# Patient Record
Sex: Male | Born: 1990
Health system: Southern US, Community
[De-identification: ages and names within clinical notes are randomized; demographics above are authoritative.]

## PROBLEM LIST (undated history)

## (undated) DIAGNOSIS — S83207A Unspecified tear of unspecified meniscus, current injury, left knee, initial encounter: Secondary | ICD-10-CM

## (undated) DIAGNOSIS — I499 Cardiac arrhythmia, unspecified: Secondary | ICD-10-CM

## (undated) DIAGNOSIS — T4145XA Adverse effect of unspecified anesthetic, initial encounter: Secondary | ICD-10-CM

## (undated) DIAGNOSIS — S83512A Sprain of anterior cruciate ligament of left knee, initial encounter: Secondary | ICD-10-CM

## (undated) DIAGNOSIS — F909 Attention-deficit hyperactivity disorder, unspecified type: Secondary | ICD-10-CM

## (undated) DIAGNOSIS — S83242A Other tear of medial meniscus, current injury, left knee, initial encounter: Secondary | ICD-10-CM

## (undated) DIAGNOSIS — L709 Acne, unspecified: Secondary | ICD-10-CM

## (undated) DIAGNOSIS — T8859XA Other complications of anesthesia, initial encounter: Secondary | ICD-10-CM

## (undated) HISTORY — PX: PERCUTANEOUS PINNING: SHX2209

---
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
## 2015-05-26 ENCOUNTER — Ambulatory Visit (INDEPENDENT_AMBULATORY_CARE_PROVIDER_SITE_OTHER): Payer: Self-pay

## 2015-05-26 ENCOUNTER — Other Ambulatory Visit: Payer: Self-pay | Admitting: Family Medicine

## 2015-05-26 DIAGNOSIS — Z021 Encounter for pre-employment examination: Secondary | ICD-10-CM

## 2017-04-02 ENCOUNTER — Ambulatory Visit (INDEPENDENT_AMBULATORY_CARE_PROVIDER_SITE_OTHER): Payer: Self-pay

## 2017-04-02 ENCOUNTER — Other Ambulatory Visit: Payer: Self-pay | Admitting: Gerontology

## 2017-04-02 DIAGNOSIS — Z021 Encounter for pre-employment examination: Secondary | ICD-10-CM

## 2017-04-24 ENCOUNTER — Encounter (HOSPITAL_COMMUNITY): Payer: Self-pay | Admitting: *Deleted

## 2017-04-24 ENCOUNTER — Emergency Department (HOSPITAL_COMMUNITY): Payer: BLUE CROSS/BLUE SHIELD

## 2017-04-24 ENCOUNTER — Emergency Department (HOSPITAL_COMMUNITY)
Admission: EM | Admit: 2017-04-24 | Discharge: 2017-04-24 | Disposition: A | Discharge status: Home / Self Care | Payer: BLUE CROSS/BLUE SHIELD | Attending: Emergency Medicine | Admitting: Emergency Medicine

## 2017-04-24 DIAGNOSIS — Y9289 Other specified places as the place of occurrence of the external cause: Secondary | ICD-10-CM | POA: Diagnosis not present

## 2017-04-24 DIAGNOSIS — M25462 Effusion, left knee: Secondary | ICD-10-CM | POA: Diagnosis not present

## 2017-04-24 DIAGNOSIS — Y30XXXA Falling, jumping or pushed from a high place, undetermined intent, initial encounter: Secondary | ICD-10-CM | POA: Insufficient documentation

## 2017-04-24 DIAGNOSIS — M25562 Pain in left knee: Secondary | ICD-10-CM | POA: Insufficient documentation

## 2017-04-24 DIAGNOSIS — Y999 Unspecified external cause status: Secondary | ICD-10-CM | POA: Diagnosis not present

## 2017-04-24 DIAGNOSIS — Y9389 Activity, other specified: Secondary | ICD-10-CM | POA: Insufficient documentation

## 2017-04-24 MED ORDER — IBUPROFEN 400 MG PO TABS
600.0000 mg | ORAL_TABLET | Freq: Once | ORAL | Status: AC
  Administered 2017-04-24: 600 mg via ORAL
  Filled 2017-04-24: qty 1

## 2017-04-24 MED ORDER — HYDROCODONE-ACETAMINOPHEN 5-325 MG PO TABS
1.0000 | ORAL_TABLET | Freq: Once | ORAL | Status: AC
  Administered 2017-04-24: 1 via ORAL
  Filled 2017-04-24: qty 1

## 2017-04-24 MED ORDER — IBUPROFEN 600 MG PO TABS: 600.0000 mg | ORAL_TABLET | Freq: Four times a day (QID) | ORAL | 0 refills | Status: DC | PRN

## 2017-04-24 NOTE — ED Provider Notes (Signed)
[facility] EMERGENCY DEPARTMENT Provider Note   CSN: [id number] Arrival date & time: 04/24/17  1347     History   Chief Complaint Chief Complaint  Patient presents with  . Knee Pain    HPI  Rodney Wiggins is a 26 y.o. Male With no pertinent past medical history, who presents with left knee pain and swelling. Patient reports he was working outside cutting a tree last night and he jumped down onto the ground, he felt a pop in his knee, afterwards he had pain and swelling primarily on the back of the knee. Patient reports he had some relief with a knee brace and over-the-counter pain medications last night but today pain is worse. Patient reports he has decreased range of motion due to pain and swelling. He is able to bear weight but reports painful. No numbness or tingling. No previous surgeries or injuries to the knee. No pain or tenderness at the ankle or hip.       History reviewed. No pertinent past medical history.  There are no active problems to display for this patient.   History reviewed. No pertinent surgical history.     Home Medications    Prior to Admission medications   Not on File    Family History No family history on file.  Social History Social History  Substance Use Topics  . Smoking status: Not on file  . Smokeless tobacco: Not on file  . Alcohol use Not on file     Allergies   Patient has no known allergies.   Review of Systems Review of Systems  Constitutional: Negative for chills and fever.  Musculoskeletal: Positive for arthralgias (L knee) and joint swelling.  Skin: Negative for color change.  Neurological: Negative for numbness.     Physical Exam Updated Vital Signs BP 135/82 (BP Location: Right Arm)   Pulse 66   Temp 97.7 F (36.5 C) (Oral)   Resp 16   SpO2 100%   Physical Exam  Constitutional: He appears well-developed and well-nourished. No distress.  HENT:  Head: Normocephalic and atraumatic.   Eyes: Right eye exhibits no discharge. Left eye exhibits no discharge.  Pulmonary/Chest: Effort normal. No respiratory distress.  Musculoskeletal:  Tenderness and swelling of left knee primarily over lateral and posterior aspect, no ecchymosis, some tenderness to palpation at tibial head and patella. ROM limited by pain and swelling. 2+ DP and PT pulses, distal sensation intact, 5/5 strength with dorsi- & plantarflexion. Pt able to bear weight w/ discomfort. Normal left ankle and hip.  Neurological: He is alert. Coordination normal.  Skin: Skin is warm and dry. Capillary refill takes less than 2 seconds. He is not diaphoretic.  Psychiatric: He has a normal mood and affect. His behavior is normal.  Nursing note and vitals reviewed.    ED Treatments / Results  Labs (all labs ordered are listed, but only abnormal results are displayed) Labs Reviewed - No data to display  EKG  EKG Interpretation None       Radiology Dg Knee Complete 4 Views Left  Result Date: 04/24/2017 CLINICAL DATA:  Pain following fall from tree EXAM: LEFT KNEE - COMPLETE 4+ VIEW COMPARISON:  None. FINDINGS: Frontal, lateral, and bilateral oblique views were obtained. There is no evident fracture or dislocation. There is a sizable joint effusion. Joint spaces appear normal. No erosive change. IMPRESSION: Sizable joint effusion. No acute fracture or dislocation. No appreciable arthropathy. Electronically Signed   By: Bretta Bang III M.D.  On: 04/24/2017 17:00    Procedures Procedures (including critical care time)  Medications Ordered in ED Medications  ibuprofen (ADVIL,MOTRIN) tablet 600 mg (600 mg Oral Given 04/24/17 1628)  HYDROcodone-acetaminophen (NORCO/VICODIN) 5-325 MG per tablet 1 tablet (1 tablet Oral Given 04/24/17 1753)     Initial Impression / Assessment and Plan / ED Course  I have reviewed the triage vital signs and the nursing notes.  Pertinent labs & imaging results that were  available during my care of the patient were reviewed by me and considered in my medical decision making (see chart for details).  Pt presents with pain and swelling of left knee, and moderately restricted range of motion. Pt unable to perform full flexion of the knee.  Pt is without systemic symptoms, erythema or redness of the joint consistent with gout or septic joint.  Patient X-Ray shows sizeable effusion, but is negative for obvious fracture or dislocation. Pain managed in ED. Pt advised to follow up with orthopedics further evaluation and treatment. Patient given brace and crutches while in ED, ice and elevation recommended and return precautions discussed. Patient will be dc home & is agreeable with above plan.  Final Clinical Impressions(s) / ED Diagnoses   Final diagnoses:  Acute pain of left knee  Effusion of left knee    New Prescriptions Discharge Medication List as of 04/24/2017  5:41 PM    START taking these medications   Details  ibuprofen (ADVIL,MOTRIN) 600 MG tablet Take 1 tablet (600 mg total) by mouth every 6 (six) hours as needed., Starting Tue 04/24/2017, Print         Dartha Lodge, PA-C 04/25/17 1610    Rolland Porter, MD 04/29/17 2354

## 2017-04-24 NOTE — ED Triage Notes (Signed)
To ED for eval after injury left knee last night while cutting a tree. States he felt his knee pop out and back in. A knee brace helped the pain last pm but today is with decrease rom.

## 2017-04-24 NOTE — ED Notes (Signed)
Patient verbalizes understanding of discharge instructions. Opportunity for questioning and answers were provided. 

## 2017-04-24 NOTE — Discharge Instructions (Signed)
Your x-ray shows no fracture or dislocation, you do have a sizable joint effusion. Continue to use ibuprofen for pain, ice and elevation and knee sleeve for comfort. Please schedule an appointment with Dr. Lajoyce Corners with orthopedics. If pain becomes worse, knee becomes more swollen, red, or you develop fevers or chills please return to the emergency department for sooner evaluation.

## 2017-04-30 ENCOUNTER — Ambulatory Visit (INDEPENDENT_AMBULATORY_CARE_PROVIDER_SITE_OTHER): Payer: BLUE CROSS/BLUE SHIELD | Admitting: Orthopedic Surgery

## 2017-04-30 ENCOUNTER — Encounter (INDEPENDENT_AMBULATORY_CARE_PROVIDER_SITE_OTHER): Payer: Self-pay | Admitting: Orthopedic Surgery

## 2017-04-30 VITALS — Ht 67.0 in | Wt 180.0 lb

## 2017-04-30 DIAGNOSIS — M25562 Pain in left knee: Secondary | ICD-10-CM

## 2017-04-30 NOTE — Progress Notes (Signed)
   Office Visit Note   Patient: Rodney MangesStephen Wiggins           Date of Birth: 05/07/1991           MRN: [id number] Visit Date: 04/30/2017              Requested by: No referring provider defined for this encounter. PCP: System, Pcp Not In  Chief Complaint  Patient presents with  . Left Knee - Pain      HPI: The patient is a 26 year old gentleman seen today for initial evaluation of left knee pain. He was cutting a tree down on 04/23/17 jumped about 10 feet out of a tree, felt a pop in his knee. Has been having medial and lateral knee pain since. Some giving way of knee. No locking or catching. No pain with extension. Pain with flexion beyond 90 degrees. Today is full weight bearing. Does have a knee brace.  States swelling is improving. Pivoting is painful  Has used ibu without relief for pain.   Assessment & Plan: Visit Diagnoses: No diagnosis found.  Plan: follow up in office in 3-4 weeks if no better. Have provided depomedrol injection today.  If no improvement may consider MRI.  Follow-Up Instructions: No Follow-up on file.   Left Knee Exam   Tenderness  The patient is experiencing tenderness in the lateral joint line and medial joint line.  Range of Motion  Extension: normal  Flexion: abnormal   Muscle Strength   The patient has normal left knee strength.  Tests  Drawer:       Anterior - negative     Posterior - negative Varus: negative Valgus: negative  Other  Erythema: absent Swelling: mild Effusion: no effusion present      Patient is alert, oriented, no adenopathy, well-dressed, normal affect, normal respiratory effort.   Imaging: No results found. No images are attached to the encounter.  Labs: No results found for: HGBA1C, ESRSEDRATE, CRP, LABURIC, REPTSTATUS, GRAMSTAIN, CULT, LABORGA  Orders:  No orders of the defined types were placed in this encounter.  No orders of the defined types were placed in this encounter.    Procedures: No  procedures performed  Clinical Data: No additional findings.  ROS:  All other systems negative, except as noted in the HPI. Review of Systems  Constitutional: Negative for chills and fever.  Musculoskeletal: Positive for arthralgias and joint swelling.  Neurological: Negative for weakness and numbness.    Objective: Vital Signs: Ht 5\' 7"  (1.702 m)   Wt 180 lb (81.6 kg)   BMI 28.19 kg/m   Specialty Comments:  No specialty comments available.  PMFS History: There are no active problems to display for this patient.  No past medical history on file.  No family history on file.  No past surgical history on file. Social History   Occupational History  . Not on file.   Social History Main Topics  . Smoking status: Former Games developermoker  . Smokeless tobacco: Current User    Types: Chew  . Alcohol use Yes     Comment: socially  . Drug use: No  . Sexual activity: Not on file

## 2018-06-18 IMAGING — DX DG KNEE COMPLETE 4+V*L*
4 series · 4 of 4 positions shown · non-contrast
Comparison: None.

CLINICAL DATA: Pain following fall from tree

EXAM:
LEFT KNEE - COMPLETE 4+ VIEW

[knee ap]
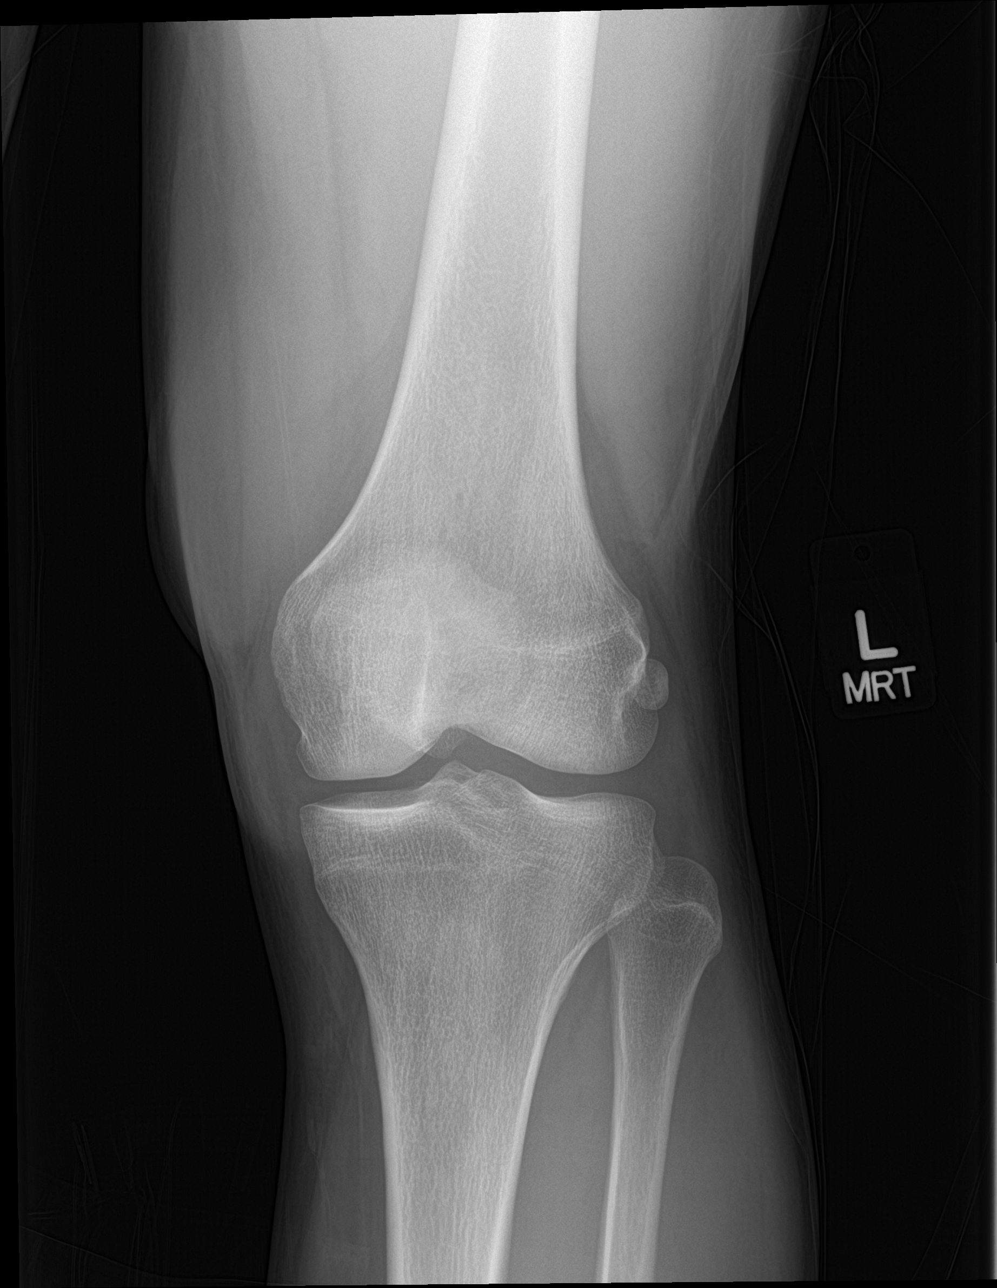

[knee lat]
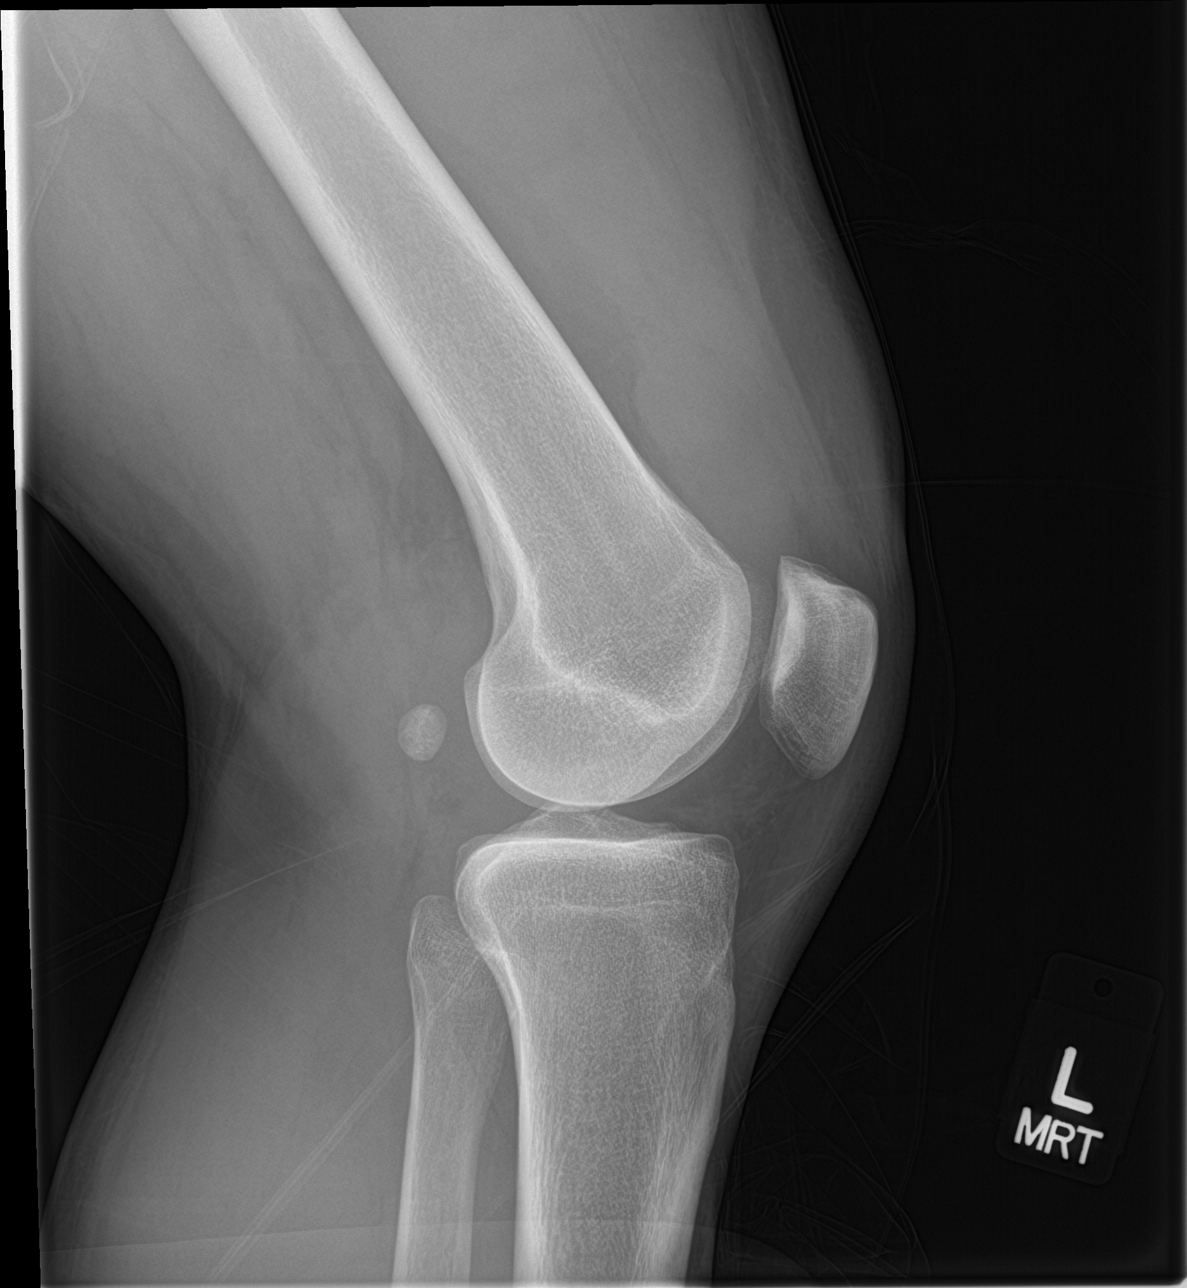

[knee obl (1 of 2)]
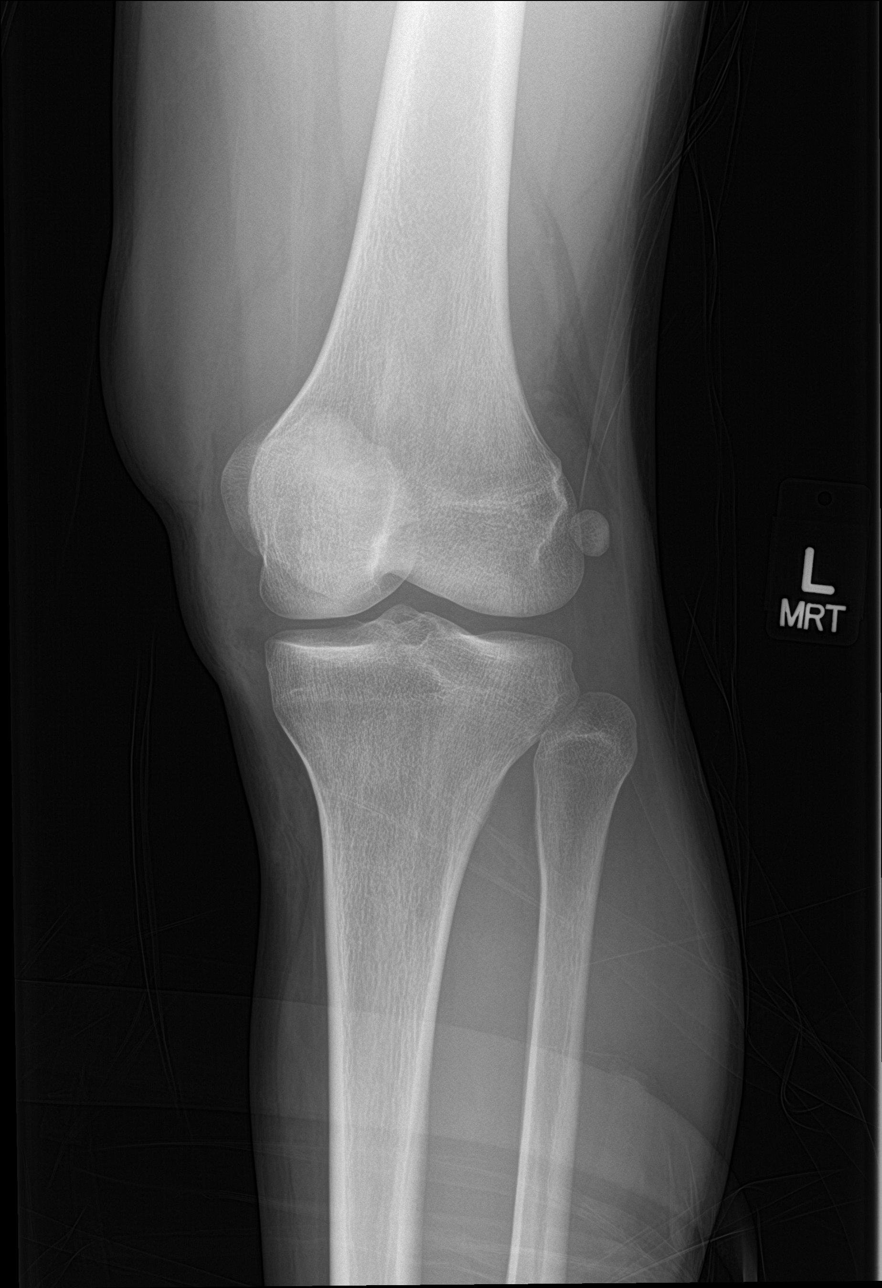

[knee obl (2 of 2)]
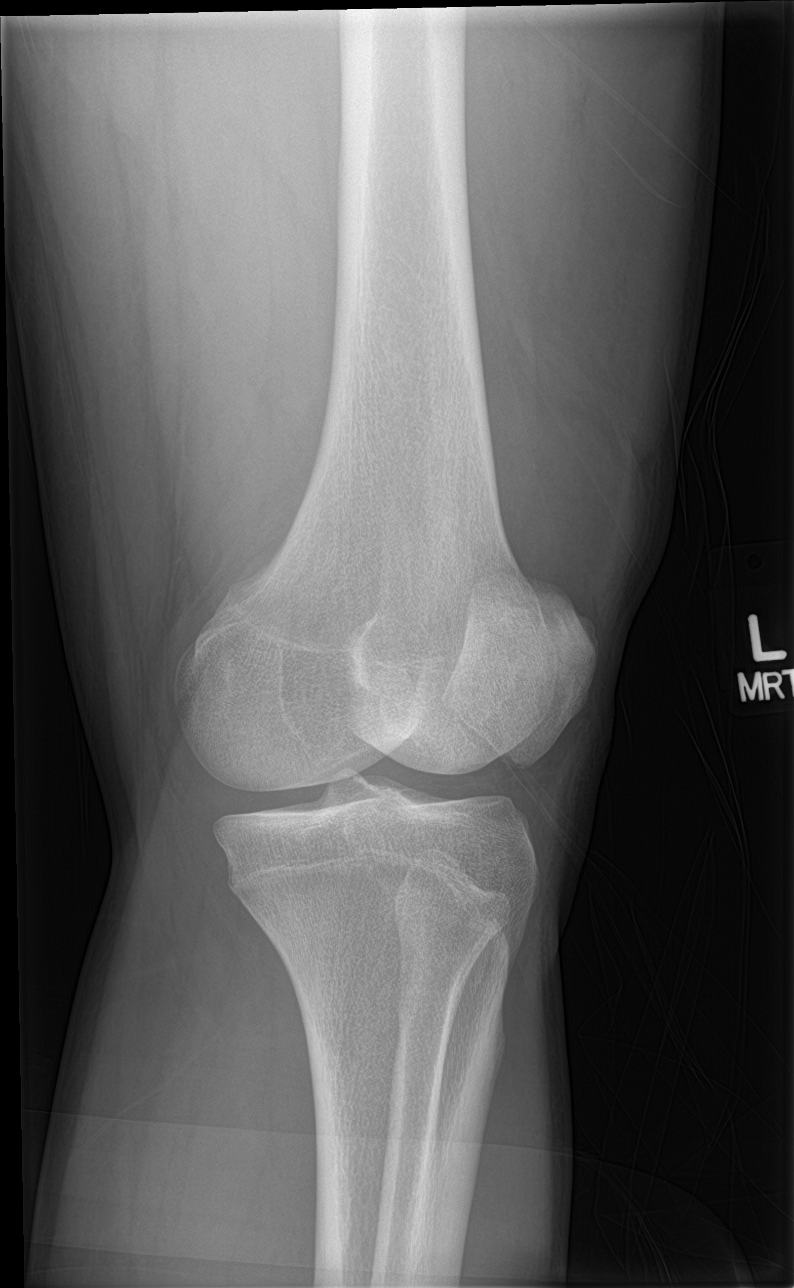

[4 of 4 positions shown; findings below may reference images not displayed]

FINDINGS: Frontal, lateral, and bilateral oblique views were obtained. There
is no evident fracture or dislocation. There is a sizable joint
effusion. Joint spaces appear normal. No erosive change.
IMPRESSION: Sizable joint effusion. No acute fracture or dislocation. No
appreciable arthropathy.

## 2018-12-12 DIAGNOSIS — E291 Testicular hypofunction: Secondary | ICD-10-CM | POA: Diagnosis not present

## 2018-12-12 DIAGNOSIS — R5383 Other fatigue: Secondary | ICD-10-CM | POA: Diagnosis not present

## 2018-12-18 DIAGNOSIS — M25562 Pain in left knee: Secondary | ICD-10-CM | POA: Diagnosis not present

## 2018-12-24 DIAGNOSIS — M25562 Pain in left knee: Secondary | ICD-10-CM | POA: Diagnosis not present

## 2018-12-26 DIAGNOSIS — S83232A Complex tear of medial meniscus, current injury, left knee, initial encounter: Secondary | ICD-10-CM | POA: Diagnosis not present

## 2018-12-26 DIAGNOSIS — M25562 Pain in left knee: Secondary | ICD-10-CM | POA: Diagnosis not present

## 2018-12-26 DIAGNOSIS — S83512A Sprain of anterior cruciate ligament of left knee, initial encounter: Secondary | ICD-10-CM | POA: Diagnosis not present

## 2019-01-01 DIAGNOSIS — L700 Acne vulgaris: Secondary | ICD-10-CM | POA: Diagnosis not present

## 2019-01-09 DIAGNOSIS — R7989 Other specified abnormal findings of blood chemistry: Secondary | ICD-10-CM | POA: Diagnosis not present

## 2019-01-09 DIAGNOSIS — T387X5A Adverse effect of androgens and anabolic congeners, initial encounter: Secondary | ICD-10-CM | POA: Diagnosis not present

## 2019-01-23 ENCOUNTER — Other Ambulatory Visit (HOSPITAL_COMMUNITY): Payer: BC Managed Care – PPO

## 2019-01-23 ENCOUNTER — Other Ambulatory Visit (HOSPITAL_COMMUNITY)
Admission: RE | Admit: 2019-01-23 | Discharge: 2019-01-23 | Disposition: A | Discharge status: Home / Self Care | Payer: BC Managed Care – PPO | Source: Ambulatory Visit | Attending: Orthopedic Surgery | Admitting: Orthopedic Surgery

## 2019-01-23 DIAGNOSIS — Z1159 Encounter for screening for other viral diseases: Secondary | ICD-10-CM | POA: Diagnosis not present

## 2019-01-24 ENCOUNTER — Other Ambulatory Visit: Payer: Self-pay

## 2019-01-24 ENCOUNTER — Encounter (HOSPITAL_BASED_OUTPATIENT_CLINIC_OR_DEPARTMENT_OTHER): Payer: Self-pay | Admitting: *Deleted

## 2019-01-24 LAB — SARS CORONAVIRUS 2 (TAT 6-24 HRS): SARS Coronavirus 2: NEGATIVE

## 2019-01-24 NOTE — Progress Notes (Signed)

## 2019-01-24 NOTE — Progress Notes (Signed)
Spoke w/ pt via phone for pre-op interview.  Npo after mn w/ exception clear liquids until 0600 then nothing by mouth, pt verbalized understanding.  Arrive at 0700.  Pt had covid test done yesterday.

## 2019-01-27 ENCOUNTER — Ambulatory Visit (HOSPITAL_BASED_OUTPATIENT_CLINIC_OR_DEPARTMENT_OTHER)
Admission: RE | Admit: 2019-01-27 | Discharge: 2019-01-27 | Disposition: A | Discharge status: Home / Self Care | Payer: BC Managed Care – PPO | Attending: Orthopedic Surgery | Admitting: Orthopedic Surgery

## 2019-01-27 ENCOUNTER — Ambulatory Visit (HOSPITAL_BASED_OUTPATIENT_CLINIC_OR_DEPARTMENT_OTHER): Payer: BC Managed Care – PPO | Admitting: Anesthesiology

## 2019-01-27 ENCOUNTER — Encounter (HOSPITAL_BASED_OUTPATIENT_CLINIC_OR_DEPARTMENT_OTHER): Payer: Self-pay | Admitting: Anesthesiology

## 2019-01-27 ENCOUNTER — Encounter (HOSPITAL_BASED_OUTPATIENT_CLINIC_OR_DEPARTMENT_OTHER)
Admission: RE | Disposition: A | Discharge status: Home / Self Care | Payer: Self-pay | Source: Home / Self Care | Attending: Orthopedic Surgery

## 2019-01-27 DIAGNOSIS — X58XXXA Exposure to other specified factors, initial encounter: Secondary | ICD-10-CM | POA: Insufficient documentation

## 2019-01-27 DIAGNOSIS — F909 Attention-deficit hyperactivity disorder, unspecified type: Secondary | ICD-10-CM | POA: Insufficient documentation

## 2019-01-27 DIAGNOSIS — S83242A Other tear of medial meniscus, current injury, left knee, initial encounter: Secondary | ICD-10-CM | POA: Diagnosis not present

## 2019-01-27 DIAGNOSIS — M23204 Derangement of unspecified medial meniscus due to old tear or injury, left knee: Secondary | ICD-10-CM | POA: Insufficient documentation

## 2019-01-27 DIAGNOSIS — Y939 Activity, unspecified: Secondary | ICD-10-CM | POA: Insufficient documentation

## 2019-01-27 DIAGNOSIS — S83282A Other tear of lateral meniscus, current injury, left knee, initial encounter: Secondary | ICD-10-CM | POA: Diagnosis present

## 2019-01-27 DIAGNOSIS — M94262 Chondromalacia, left knee: Secondary | ICD-10-CM | POA: Insufficient documentation

## 2019-01-27 DIAGNOSIS — Z79899 Other long term (current) drug therapy: Secondary | ICD-10-CM | POA: Insufficient documentation

## 2019-01-27 DIAGNOSIS — G8918 Other acute postprocedural pain: Secondary | ICD-10-CM | POA: Diagnosis not present

## 2019-01-27 DIAGNOSIS — M93262 Osteochondritis dissecans, left knee: Secondary | ICD-10-CM | POA: Diagnosis not present

## 2019-01-27 DIAGNOSIS — S83512A Sprain of anterior cruciate ligament of left knee, initial encounter: Secondary | ICD-10-CM | POA: Diagnosis present

## 2019-01-27 DIAGNOSIS — M23201 Derangement of unspecified lateral meniscus due to old tear or injury, left knee: Secondary | ICD-10-CM | POA: Diagnosis not present

## 2019-01-27 DIAGNOSIS — Z791 Long term (current) use of non-steroidal anti-inflammatories (NSAID): Secondary | ICD-10-CM | POA: Diagnosis not present

## 2019-01-27 HISTORY — DX: Other complications of anesthesia, initial encounter: T88.59XA

## 2019-01-27 HISTORY — DX: Other tear of medial meniscus, current injury, left knee, initial encounter: S83.242A

## 2019-01-27 HISTORY — DX: Acne, unspecified: L70.9

## 2019-01-27 HISTORY — DX: Sprain of anterior cruciate ligament of left knee, initial encounter: S83.512A

## 2019-01-27 HISTORY — PX: ANTERIOR CRUCIATE LIGAMENT REPAIR: SHX115

## 2019-01-27 HISTORY — DX: Unspecified tear of unspecified meniscus, current injury, left knee, initial encounter: S83.207A

## 2019-01-27 HISTORY — DX: Attention-deficit hyperactivity disorder, unspecified type: F90.9

## 2019-01-27 HISTORY — DX: Cardiac arrhythmia, unspecified: I49.9

## 2019-01-27 SURGERY — RECONSTRUCTION, KNEE, ACL, USING HAMSTRING GRAFT
Anesthesia: General | Laterality: Left

## 2019-01-27 MED ORDER — LACTATED RINGERS IV SOLN
INTRAVENOUS | Status: DC
  Administered 2019-01-27: 08:00:00 via INTRAVENOUS
  Administered 2019-01-27: 50 mL/h via INTRAVENOUS
  Administered 2019-01-27: 11:00:00 via INTRAVENOUS
  Filled 2019-01-27: qty 1000

## 2019-01-27 MED ORDER — HYDROMORPHONE HCL 2 MG/ML IJ SOLN
INTRAMUSCULAR | Status: AC
  Filled 2019-01-27: qty 1

## 2019-01-27 MED ORDER — FENTANYL CITRATE (PF) 100 MCG/2ML IJ SOLN
100.0000 ug | Freq: Once | INTRAMUSCULAR | Status: AC
  Administered 2019-01-27: 100 ug via INTRAVENOUS
  Filled 2019-01-27: qty 2

## 2019-01-27 MED ORDER — MIDAZOLAM HCL 2 MG/2ML IJ SOLN
INTRAMUSCULAR | Status: AC
  Filled 2019-01-27: qty 2

## 2019-01-27 MED ORDER — SODIUM CHLORIDE 0.9 % IR SOLN
Status: DC | PRN
  Administered 2019-01-27: 3000 mL

## 2019-01-27 MED ORDER — ARTIFICIAL TEARS OPHTHALMIC OINT
TOPICAL_OINTMENT | OPHTHALMIC | Status: AC
  Filled 2019-01-27: qty 3.5

## 2019-01-27 MED ORDER — ONDANSETRON HCL 4 MG/2ML IJ SOLN
INTRAMUSCULAR | Status: DC | PRN
  Administered 2019-01-27: 4 mg via INTRAVENOUS

## 2019-01-27 MED ORDER — HYDROMORPHONE HCL 1 MG/ML IJ SOLN
INTRAMUSCULAR | Status: DC | PRN
  Administered 2019-01-27 (×2): .5 mg via INTRAVENOUS
  Administered 2019-01-27: 1 mg via INTRAVENOUS

## 2019-01-27 MED ORDER — PROPOFOL 10 MG/ML IV BOLUS
INTRAVENOUS | Status: AC
  Filled 2019-01-27: qty 40

## 2019-01-27 MED ORDER — ROPIVACAINE HCL 5 MG/ML IJ SOLN
INTRAMUSCULAR | Status: DC | PRN
  Administered 2019-01-27: 30 mL via PERINEURAL

## 2019-01-27 MED ORDER — DEXAMETHASONE SODIUM PHOSPHATE 10 MG/ML IJ SOLN
INTRAMUSCULAR | Status: DC | PRN
  Administered 2019-01-27: 10 mg via INTRAVENOUS

## 2019-01-27 MED ORDER — MEPERIDINE HCL 25 MG/ML IJ SOLN
6.2500 mg | INTRAMUSCULAR | Status: DC | PRN
  Filled 2019-01-27: qty 1

## 2019-01-27 MED ORDER — HYDROMORPHONE HCL 1 MG/ML IJ SOLN
0.2500 mg | INTRAMUSCULAR | Status: DC | PRN
  Administered 2019-01-27 (×5): 0.5 mg via INTRAVENOUS
  Filled 2019-01-27: qty 0.5

## 2019-01-27 MED ORDER — OXYCODONE HCL 5 MG/5ML PO SOLN
5.0000 mg | Freq: Once | ORAL | Status: AC | PRN
  Filled 2019-01-27: qty 5

## 2019-01-27 MED ORDER — DEXAMETHASONE SODIUM PHOSPHATE 10 MG/ML IJ SOLN
INTRAMUSCULAR | Status: AC
  Filled 2019-01-27: qty 1

## 2019-01-27 MED ORDER — OXYCODONE HCL 5 MG PO TABS
ORAL_TABLET | ORAL | Status: AC
  Filled 2019-01-27: qty 1

## 2019-01-27 MED ORDER — ONDANSETRON HCL 4 MG/2ML IJ SOLN
INTRAMUSCULAR | Status: AC
  Filled 2019-01-27: qty 2

## 2019-01-27 MED ORDER — ROCURONIUM BROMIDE 10 MG/ML (PF) SYRINGE
PREFILLED_SYRINGE | INTRAVENOUS | Status: AC
  Filled 2019-01-27: qty 10

## 2019-01-27 MED ORDER — ONDANSETRON 4 MG PO TBDP: 4.0000 mg | ORAL_TABLET | Freq: Three times a day (TID) | ORAL | 0 refills | Status: DC | PRN

## 2019-01-27 MED ORDER — CEFAZOLIN SODIUM-DEXTROSE 2-4 GM/100ML-% IV SOLN
2.0000 g | INTRAVENOUS | Status: AC
  Administered 2019-01-27: 2 g via INTRAVENOUS
  Filled 2019-01-27: qty 100

## 2019-01-27 MED ORDER — MIDAZOLAM HCL 2 MG/2ML IJ SOLN
INTRAMUSCULAR | Status: DC | PRN
  Administered 2019-01-27: 2 mg via INTRAVENOUS

## 2019-01-27 MED ORDER — FENTANYL CITRATE (PF) 100 MCG/2ML IJ SOLN
INTRAMUSCULAR | Status: AC
  Filled 2019-01-27: qty 2

## 2019-01-27 MED ORDER — LIDOCAINE 2% (20 MG/ML) 5 ML SYRINGE
INTRAMUSCULAR | Status: DC | PRN
  Administered 2019-01-27: 60 mg via INTRAVENOUS

## 2019-01-27 MED ORDER — HYDROMORPHONE HCL 1 MG/ML IJ SOLN
INTRAMUSCULAR | Status: AC
  Filled 2019-01-27: qty 1

## 2019-01-27 MED ORDER — PROPOFOL 10 MG/ML IV BOLUS
INTRAVENOUS | Status: DC | PRN
  Administered 2019-01-27: 170 mg via INTRAVENOUS

## 2019-01-27 MED ORDER — OXYCODONE HCL 5 MG PO TABS
5.0000 mg | ORAL_TABLET | Freq: Once | ORAL | Status: AC | PRN
  Administered 2019-01-27: 5 mg via ORAL
  Filled 2019-01-27: qty 1

## 2019-01-27 MED ORDER — OXYCODONE HCL 5 MG PO TABS: 5.0000 mg | ORAL_TABLET | ORAL | 0 refills | Status: AC | PRN

## 2019-01-27 MED ORDER — LIDOCAINE 2% (20 MG/ML) 5 ML SYRINGE
INTRAMUSCULAR | Status: AC
  Filled 2019-01-27: qty 5

## 2019-01-27 MED ORDER — PROMETHAZINE HCL 25 MG/ML IJ SOLN
6.2500 mg | INTRAMUSCULAR | Status: DC | PRN
  Filled 2019-01-27: qty 1

## 2019-01-27 MED ORDER — MIDAZOLAM HCL 2 MG/2ML IJ SOLN
2.0000 mg | Freq: Once | INTRAMUSCULAR | Status: AC
  Administered 2019-01-27: 2 mg via INTRAVENOUS
  Filled 2019-01-27: qty 2

## 2019-01-27 MED ORDER — CHLORHEXIDINE GLUCONATE 4 % EX LIQD
60.0000 mL | Freq: Once | CUTANEOUS | Status: DC
  Filled 2019-01-27: qty 118

## 2019-01-27 MED ORDER — FENTANYL CITRATE (PF) 100 MCG/2ML IJ SOLN
INTRAMUSCULAR | Status: DC | PRN
  Administered 2019-01-27 (×4): 50 ug via INTRAVENOUS

## 2019-01-27 MED ORDER — CEFAZOLIN SODIUM-DEXTROSE 2-4 GM/100ML-% IV SOLN
INTRAVENOUS | Status: AC
  Filled 2019-01-27: qty 100

## 2019-01-27 SURGICAL SUPPLY — 87 items
ANCHOR BUTTON TIGHTROPE ACL RT (Orthopedic Implant) ×2 IMPLANT
BANDAGE ELASTIC 6 VELCRO ST LF (GAUZE/BANDAGES/DRESSINGS) ×3 IMPLANT
BANDAGE ESMARK 6X9 LF (GAUZE/BANDAGES/DRESSINGS) IMPLANT
BLADE 4.2CUDA (BLADE) IMPLANT
BLADE CUDA 5.5 (BLADE) IMPLANT
BLADE CUDA GRT WHITE 3.5 (BLADE) IMPLANT
BLADE GREAT WHITE 4.2 (BLADE) IMPLANT
BLADE GREAT WHITE 4.2MM (BLADE)
BLADE GREAT WHITE SHAVER 5.5 (BLADE) IMPLANT
BLADE GREAT WHITE SHAVER 5.5MM (BLADE)
BLADE SURG 10 STRL SS (BLADE) ×3 IMPLANT
BLADE SURG 15 STRL LF DISP TIS (BLADE) ×1 IMPLANT
BLADE SURG 15 STRL SS (BLADE) ×3
BNDG CMPR 9X6 STRL LF SNTH (GAUZE/BANDAGES/DRESSINGS)
BNDG ESMARK 6X9 LF (GAUZE/BANDAGES/DRESSINGS)
BUR OVAL 4.0 (BURR) IMPLANT
BUR OVAL 6.0 (BURR) IMPLANT
BUR VERTEX HOODED 4.5 (BURR) IMPLANT
BURR OVAL 8 FLU 4.0MM X 13CM (MISCELLANEOUS) ×1
BURR OVAL 8 FLU 4.0X13 (MISCELLANEOUS) ×1 IMPLANT
CANISTER SUCTION 1200CC (MISCELLANEOUS) ×3 IMPLANT
CLOSURE WOUND 1/2 X4 (GAUZE/BANDAGES/DRESSINGS) ×1
COVER BACK TABLE 60X90IN (DRAPES) ×3 IMPLANT
COVER WAND RF STERILE (DRAPES) ×6 IMPLANT
CUFF TOURN SGL QUICK 34 (TOURNIQUET CUFF) ×3
CUFF TRNQT CYL 34X4.125X (TOURNIQUET CUFF) ×1 IMPLANT
DRAPE ARTHROSCOPY W/POUCH 114 (DRAPES) ×3 IMPLANT
DRAPE C-ARM 42X72 X-RAY (DRAPES) IMPLANT
DRAPE INCISE IOBAN 66X45 STRL (DRAPES) IMPLANT
DRAPE SHEET LG 3/4 BI-LAMINATE (DRAPES) IMPLANT
DRAPE U-SHAPE 47X51 STRL (DRAPES) ×3 IMPLANT
DRILL FLIPCUTTER III 6-12 (ORTHOPEDIC DISPOSABLE SUPPLIES) IMPLANT
DURAPREP 26ML APPLICATOR (WOUND CARE) ×3 IMPLANT
ELECT REM PT RETURN 9FT ADLT (ELECTROSURGICAL) ×3
ELECTRODE REM PT RTRN 9FT ADLT (ELECTROSURGICAL) ×1 IMPLANT
FIBERSTICK 2 (SUTURE) IMPLANT
FLIPCUTTER III 6-12 AR-1204FF (ORTHOPEDIC DISPOSABLE SUPPLIES) ×3
GAUZE SPONGE 4X4 12PLY STRL (GAUZE/BANDAGES/DRESSINGS) ×3 IMPLANT
GAUZE XEROFORM 1X8 LF (GAUZE/BANDAGES/DRESSINGS) ×3 IMPLANT
GLOVE BIO SURGEON STRL SZ7.5 (GLOVE) ×3 IMPLANT
GLOVE INDICATOR 8.0 STRL GRN (GLOVE) ×3 IMPLANT
GOWN STRL REUS W/ TWL XL LVL3 (GOWN DISPOSABLE) ×1 IMPLANT
GOWN STRL REUS W/TWL XL LVL3 (GOWN DISPOSABLE) ×3
IV NS IRRIG 3000ML ARTHROMATIC (IV SOLUTION) ×12 IMPLANT
KIT TURNOVER CYSTO (KITS) ×3 IMPLANT
KNEE WRAP E Z 3 GEL PACK (MISCELLANEOUS) ×3 IMPLANT
MANIFOLD NEPTUNE II (INSTRUMENTS) ×3 IMPLANT
NEEDLE HYPO 22GX1.5 SAFETY (NEEDLE) IMPLANT
PACK ARTHROSCOPY DSU (CUSTOM PROCEDURE TRAY) ×3 IMPLANT
PACK BASIN DAY SURGERY FS (CUSTOM PROCEDURE TRAY) ×3 IMPLANT
PAD ABD 8X10 STRL (GAUZE/BANDAGES/DRESSINGS) ×3 IMPLANT
PAD ARMBOARD 7.5X6 YLW CONV (MISCELLANEOUS) IMPLANT
PADDING CAST COTTON 6X4 STRL (CAST SUPPLIES) ×2 IMPLANT
PENCIL BUTTON HOLSTER BLD 10FT (ELECTRODE) IMPLANT
PK GRAFTLINK AUTO IMPLANT SYST (Anchor) ×3 IMPLANT
PROBE APOLLO 90XL (SURGICAL WAND) ×2 IMPLANT
PROBE BIPOLAR 50 DEGREE SUCT (MISCELLANEOUS) ×3 IMPLANT
PROBE BIPOLAR ATHRO 135MM 90D (MISCELLANEOUS) IMPLANT
SET ARTHROSCOPY TUBING (MISCELLANEOUS) ×3
SET ARTHROSCOPY TUBING LN (MISCELLANEOUS) ×1 IMPLANT
SHAVER 4.2 MM LANZA 9391A (BLADE) ×3 IMPLANT
SPONGE LAP 4X18 RFD (DISPOSABLE) ×3 IMPLANT
STRIP CLOSURE SKIN 1/2X4 (GAUZE/BANDAGES/DRESSINGS) ×2 IMPLANT
SUCTION FRAZIER HANDLE 10FR (MISCELLANEOUS) ×2
SUCTION TUBE FRAZIER 10FR DISP (MISCELLANEOUS) ×1 IMPLANT
SUT 2 FIBERLOOP 20 STRT BLUE (SUTURE)
SUT FIBERWIRE #2 38 REV NDL BL (SUTURE)
SUT FIBERWIRE #2 38 T-5 BLUE (SUTURE)
SUT MNCRL AB 3-0 PS2 18 (SUTURE) ×3 IMPLANT
SUT VIC AB 0 CT2 27 (SUTURE) ×3 IMPLANT
SUT VIC AB 2-0 CT2 27 (SUTURE) ×3 IMPLANT
SUTURE 2 FIBERLOOP 20 STRT BLU (SUTURE) IMPLANT
SUTURE FIBERWR #2 38 T-5 BLUE (SUTURE) IMPLANT
SUTURE FIBERWR#2 38 REV NDL BL (SUTURE) IMPLANT
SUTURE TAPE 1.3 40 TPR END (SUTURE) IMPLANT
SUTURE TIGERSTICK 2 TIGERWIR 2 (MISCELLANEOUS) IMPLANT
SUTURETAPE 1.3 40 TPR END (SUTURE) ×3
SYR CONTROL 10ML LL (SYRINGE) ×3 IMPLANT
SYSTEM GRAFT IMPLANT AUTOGRAFT (Anchor) IMPLANT
SYSTEM IMPL ACL/PCL SWIVILLOCK (Anchor) ×2 IMPLANT
TIGERSTICK 2 TIGERWIRE 2 (MISCELLANEOUS)
TOWEL OR 17X26 10 PK STRL BLUE (TOWEL DISPOSABLE) ×6 IMPLANT
TUBE CONNECTING 12'X1/4 (SUCTIONS) ×1
TUBE CONNECTING 12X1/4 (SUCTIONS) ×2 IMPLANT
WAND 30 DEG SABER W/CORD (SURGICAL WAND) IMPLANT
WATER STERILE IRR 500ML POUR (IV SOLUTION) ×3 IMPLANT
WRAP KNEE MAXI GEL POST OP (GAUZE/BANDAGES/DRESSINGS) ×2 IMPLANT

## 2019-01-27 NOTE — Anesthesia Preprocedure Evaluation (Signed)
Anesthesia Evaluation  Patient identified by MRN, date of birth, ID band Patient awake    Reviewed: Allergy & Precautions, NPO status , Patient's Chart, lab work & pertinent test results  Airway Mallampati: II  TM Distance: >3 FB Neck ROM: Full    Dental no notable dental hx.    Pulmonary neg pulmonary ROS,    Pulmonary exam normal breath sounds clear to auscultation       Cardiovascular negative cardio ROS Normal cardiovascular exam Rhythm:Regular Rate:Normal     Neuro/Psych PSYCHIATRIC DISORDERS negative neurological ROS     GI/Hepatic negative GI ROS, Neg liver ROS,   Endo/Other  negative endocrine ROS  Renal/GU negative Renal ROS  negative genitourinary   Musculoskeletal negative musculoskeletal ROS (+)   Abdominal   Peds negative pediatric ROS (+)  Hematology negative hematology ROS (+)   Anesthesia Other Findings ADHD  Reproductive/Obstetrics negative OB ROS                             Anesthesia Physical Anesthesia Plan  ASA: II  Anesthesia Plan: General   Post-op Pain Management:  Regional for Post-op pain   Induction: Intravenous  PONV Risk Score and Plan: 2 and Ondansetron, Midazolam and Treatment may vary due to age or medical condition  Airway Management Planned: LMA  Additional Equipment:   Intra-op Plan:   Post-operative Plan: Extubation in OR  Informed Consent: I have reviewed the patients History and Physical, chart, labs and discussed the procedure including the risks, benefits and alternatives for the proposed anesthesia with the patient or authorized representative who has indicated his/her understanding and acceptance.     Dental advisory given  Plan Discussed with: CRNA  Anesthesia Plan Comments:         Anesthesia Quick Evaluation

## 2019-01-27 NOTE — Op Note (Signed)
Surgery Date: 01/27/19   Surgeon(s): Yolonda Kidaogers, Tavaras Goody Patrick, MD  ASSIST: none  Implants: Arthrex all inside cortical buttons on femur and tibia 4.75 PEEK swivel lock x 1.  ANESTHESIA: general, and adductor block  IV FLUIDS AND URINE: See anesthesia.  TOURNIQUET:  114  Minutes at 300 mmHg  DRAINS: none  COMPLICATIONS: None.   ESTIMATED BLOOD LOSS: minimal  PREOPERATIVE DIAGNOSES:  1. Left knee medial meniscus tear 2.   Left knee complete ACL rupture  POSTOPERATIVE DIAGNOSES:  1.  Left knee medial meniscus tear 2.  Left knee complete ACL rupture 3.  Left knee lateral meniscus tear 4.  Left knee osteochondral lesion medial femoral condyle, grade 4 10 mm x 5 mm  PROCEDURES PERFORMED:  1.   Left knee arthroscopy with Hamstring autograft ACL reconstruction 2.  Partial lateral and medial meniscectomies left knee 3.  Left knee abrasion chondroplasty of medial femoral condyle grade IV chondromalacia  DESCRIPTION OF PROCEDURE:  Rodney MangesStephen Wiggins is a 28 year old male with left knee Lateral and medial meniscus tears, Complete ACL rupture, and cartlige lesion of the medial femoral condyle.  They sustained these injuries about years prior to coming to the operating room today.  After a short period of prehabilitation to allow for return of ROM and quadriceps strenght, we discussed proceeding with arthroscopically assisted hamstring autograft ACL reconstruction and medial and lateral meniscectomy versus repair.  We reviewed the risks benefits and indications of this procedure including but not limited to bleeding, infection, damage to neurovascular structures, need for future surgery, developed an of arthrosis, rupture of graft, continued instability of the knee, and developement of blood clots and risk of anesthesia.  All questions answered.  The patient was identified in the preoperative holding area and the operative extremity was marked. The patient was brought to the  operating room and transferred to operating table in a supine position. Satisfactory general anesthesia was induced by anesthesiology.    Examination under anesthesia revealed a grade 2B Lachman, grade 3 pivot shift, and stable to varus and valgus stress.   The procedure was initiated by obtaining a hamstring graft. An Esmarch was used to wrap out the leg and the tourniquet was raised for a short time. A 3-inch incision was created over the pes anserine. Dissection was carried out to the level of the sartorius, which was divided above the gracilis tendon, then everted to reveal the semitendinosus tendon which was released distally, tagged and stripped per usual.The sartorius and gracilis were then repaired back to their insertion with heavy Vicryl suture, that was later incorporated into the swivel lock anchor.  At the back table, I nextprepared the graft by removing muscle and tenosynovium. The semitendinosis graft were was cut to length and quadrupled, looping around the Arthrex ACL TightRope for the femoral side and ABS tightrope for the tibial side. The two free ends of the autograft were secured to each other at the tibial side with #2interlocking FiberWire sutures.The remainder of the graft was then circumferentially secured according to the manufacturer's recommendations with a 0FiberWire suture twice at the tibial side and twice at the femoral side. The graft was pretensioned on the back table and wrapped in a saline-soaked gauze.  The graft measured 70 mm in total length, 10 mm on femoral tunnel diameter, and 9.5 mm diameter on the tibial tunnel.  Standard anterolateral, anteromedial arthroscopy portals were obtained. The anteromedial portal was obtained with a spinal needle for localization under direct visualization with subsequent diagnostic findings.  Anteromedial and anterolateral chambers: mild synovitis. The synovitis was debrided with a 4.5 mm full radius shaver through both  the anteromedial and lateral portals.   Suprapatellar pouch and gutters: no synovitis or debris. Patella chondral surface: Grade 0 Trochlear chondral surface: Grade 0 Patellofemoral tracking: Midline, no tilt Medial meniscus: radial tear at the midbody in the white zone, flipped up along the medial gutter.  Separate horizontal tear of the posterior horn in the red-white zone  Medial femoral condyle flexion bearing surface: Grade 0 Medial femoral condyle extension bearing surface: Grade IV of 60mmx 5 mm Medial tibial plateau: Grade 0 Anterior cruciate ligament:Complete mid substance tear Posterior cruciate ligament:stable Lateral meniscus: White zone posterior horn radial tear just anterior to the popliteal hiatus.   Lateral femoral condyle flexion bearing surface: Grade 0 Lateral femoral condyle extension bearing surface: Grade 0 Lateral tibial plateau: Grade 1   Next, We turned our attention to the medial meniscus tear.  There were 2 separate tears of the medial meniscus.  1 being a white zone tear of the mid body.  This was a radial tear with a parrot-beak appearance.  The unstable flap was flipped proximally along the medial gutter and wedged.  This was resected with combination of meniscal biter and motorized shaver.  There was also a horizontal component of the meniscus tear along the posterior horn.  This was likewise resected utilizing meniscal biter and shaver to produce a stable border.  The lateral meniscus was inspected closely and found to have a chronic appearing radial tear just at the area of the popliteal tendon.  When probed a portion of the mid body did pull into the joint.  This was resected to prevent any cartilage injury or mechanical catching.  We utilized shaver and biters for this.  Interestingly, the posterior horn appeared chronically injured and was quite diminutive.  There were no unstable tears however along the posterior horn.  The anterior root root and posterior  root were intact.  Next, the ACL reconstruction was undertaken. The ACL stump was removed with thermal ablation and shaver and anatomic bony landmarks were marked for the placement of the femoral and tibial sockets.Arthrex retroguides and Flipcutters were used to create the sockets and perform the procedure by an all-inside GraftLink technique.The femoral socket was created at the inferior portion of the bifurcate ridge of the lateral femoral wall with a size 43mm FlipCutter to a depth of 20 mm while the tibial socket was created at the center of the ACL footprint from front to back and toward the base of the medial tibial eminence from medial to lateral, to a depth of 23-25 mm with a 9.51mm FlipCutter. Bony debris was removed and the edges of socket apertures were smoothed. Suture shuttles were used to deliver the graft into the femoral socket first and the tibial socket second. The graft was then secured within the sockets, cinching the self-locking sutures overtop of the proximal and distal cortical buttons with the knee in a reduced position maintained at 20 degrees flexion while a moderate force posterior drawer was applied.After this preliminary tensioning, the knee was placed through several flexion-extension cycles to eliminate any graft settling or excursion and the graft was re-tensioned in the same manner and the sutures were tied over top of the buttons proximally and distally, and the four tibial sided suture arms were secondarily secured at the proximal tibia with 1SwiveLock anchor.Of note we did also pass a free labral tape through the ACL fixation as  an separate internal brace backup fixation.  Final images of the ACL graft were obtained, revealing no lateral wall or roof impingement of the graft at the notch through range of motion.Stability of the ACL graft was assessed and found to be normalized at grade 0 lachman and grade 0 pivot shift.   The wounds were all closed in layers  per usual.Dressings were applied and a brace placed And locked in 0 of flexion..There were no apparent complications.All counts were correct x2. The patient was awakened and taken to recovery room in satisfactory condition.  POSTOPERATIVE PLAN:  Redmond SchoolStephen Easterwill be touch down weight bearing on crutches until cleared by The therapist. They will likewise be in The knee brace with it locked until quad function is normalized.  He will then progress on the hamstring autograft ACL protocol.  They will be on 81 mg asa daily for 1 month for DVT PPX. they will return to the clinic to see the surgeon in 2 weeks.  Yolonda KidaJason Patrick Elford Evilsizer

## 2019-01-27 NOTE — Transfer of Care (Signed)
Immediate Anesthesia Transfer of Care Note  Patient: Rodney Wiggins  Procedure(s) Performed: Left knee arthroscopic anterior cruciate ligament reconstruction with hamstring autograft, medial meniscus repair, possible microfracture (Left )  Patient Location: PACU  Anesthesia Type:General  Level of Consciousness: awake, alert , oriented and patient cooperative  Airway & Oxygen Therapy: Patient Spontanous Breathing and Patient connected to nasal cannula oxygen  Post-op Assessment: Report given to RN and Post -op Vital signs reviewed and stable  Post vital signs: Reviewed and stable  Last Vitals:  Vitals Value Taken Time  BP    Temp    Pulse    Resp 15 01/27/19 1123  SpO2    Vitals shown include unvalidated device data.  Last Pain:  Vitals:   01/27/19 0702  TempSrc: Oral  PainSc: 0-No pain      Patients Stated Pain Goal: 7 (43/32/95 1884)  Complications: No apparent anesthesia complications

## 2019-01-27 NOTE — Progress Notes (Signed)
Assisted Dr. Miller with left, ultrasound guided, femoral block. Side rails up, monitors on throughout procedure. See vital signs in flow sheet. Tolerated Procedure well. 

## 2019-01-27 NOTE — Anesthesia Procedure Notes (Signed)
Anesthesia Regional Block: Femoral nerve block   Pre-Anesthetic Checklist: ,, timeout performed, Correct Patient, Correct Site, Correct Laterality, Correct Procedure, Correct Position, site marked, Risks and benefits discussed,  Surgical consent,  Pre-op evaluation,  At surgeon's request and post-op pain management  Laterality: Left  Prep: chloraprep       Needles:  Injection technique: Single-shot  Needle Type: Stimiplex     Needle Length: 9cm  Needle Gauge: 21     Additional Needles:   Procedures:,,,, ultrasound used (permanent image in chart),,,,  Narrative:  Start time: 01/27/2019 8:19 AM End time: 01/27/2019 8:24 AM Injection made incrementally with aspirations every 5 mL.  Performed by: Personally  Anesthesiologist: Lynda Rainwater, MD

## 2019-01-27 NOTE — Anesthesia Procedure Notes (Signed)
Procedure Name: LMA Insertion Date/Time: 01/27/2019 8:54 AM Performed by: Wanita Chamberlain, CRNA Pre-anesthesia Checklist: Patient identified, Emergency Drugs available, Suction available, Patient being monitored and Timeout performed Patient Re-evaluated:Patient Re-evaluated prior to induction Oxygen Delivery Method: Circle system utilized Preoxygenation: Pre-oxygenation with 100% oxygen Induction Type: IV induction Ventilation: Mask ventilation without difficulty LMA: LMA inserted LMA Size: 4.0 Number of attempts: 1 Airway Equipment and Method: Bite block Placement Confirmation: breath sounds checked- equal and bilateral,  CO2 detector,  positive ETCO2 and ETT inserted through vocal cords under direct vision Tube secured with: Tape Dental Injury: Teeth and Oropharynx as per pre-operative assessment

## 2019-01-27 NOTE — Discharge Instructions (Signed)
°  Post Anesthesia Home Care Instructions  Activity: Get plenty of rest for the remainder of the day. A responsible individual must stay with you for 24 hours following the procedure.  For the next 24 hours, DO NOT: -Drive a car -Paediatric nurse -Drink alcoholic beverages -Take any medication unless instructed by your physician -Make any legal decisions or sign important papers.  Meals: Start with liquid foods such as gelatin or soup. Progress to regular foods as tolerated. Avoid greasy, spicy, heavy foods. If nausea and/or vomiting occur, drink only clear liquids until the nausea and/or vomiting subsides. Call your physician if vomiting continues.  Special Instructions/Symptoms: Your throat may feel dry or sore from the anesthesia or the breathing tube placed in your throat during surgery. If this causes discomfort, gargle with warm salt water. The discomfort should disappear within 24 hours.        DISCHARGE INSTRUCTIONS: ________________________________________________________________________________ ACL RECONSTRUCTION HOME EXERCISE PROGRAM (0-2 WEEKS)     1. Elevate the leg above your heart as often as possible. 2. Weight bear as tolerated with the immobilizer.  Use crutches as needed, progress from 2 to 1 crutch as able using one crutch on opposite side of surgical knee.  Do not limp and do not walk too much!! 3. Wear immobilizer all the time except when exercising.  Wear immobilizer at night!! 4. Start normal showering according to your surgeons instructions. 5. Goals for first two weeks:  minimal swelling, motion 0-90, walking with immobilizer without crutches and positive attitude about PT. 6. Exercise program to be performed as soon as as able 3-4 times per day followed by ice pack or ice bag for 20 minutes. A. Sitting over edge of bed or counter - Passive Range of Motion using opposite leg to bend and straighten knee as much as possible (5-10 minutes) B. Thigh tensing  exercise - Push the knee into a towel roll and try to raise heel slightly off floor.  Hold for 8 seconds, rest 10 seconds. (15 times every hour) C. Straight leg raise lying on your back with opposite knee bent, keeping surgical knee as straight as possible.  You may do with knee immobilizer initially (3-5 sets of ten) D. Hamstring tensing - Dig heels into floor as if you were attempting to bend knee.  Do not allow knee to bend.  Hold for 8 seconds, rest 10 seconds E. Towel stretch - Towel around ball of foot stretching calf by pulling the ankle back while gradually leaning forward to stretch the back of the knee and thigh.  (Hold 20 seconds, 4 times each) F. Back of knee stretch - While lying on stomach, knee just over edge of bed (hold 2 minutes, 4 times) 7. Use pain medication as needed.  To prevent constipation use Colace 100mg . twice a day while on pain medication.  If constipated, use Miralax 17 gm once a day and drink plenty of fluids.  These medications can be obtained at the pharmacy without a prescription.   8. Follow up in the office in 10-14 days. 9. Leave dressing in place and reinforce as needed for the first 3 days.  You then may remove the Ace bandage and white sterile bandages and begin showering.  You should then cover your wounds with Ace bandage after each shower.  Please do not submerge underwater.

## 2019-01-27 NOTE — H&P (Signed)
ORTHOPAEDIC H and P  REQUESTING PHYSICIAN: Yolonda Kidaogers, Jason Patrick, MD  PCP:  Rebecka ApleyHemberg, Katherine V, NP  Chief Complaint: Left knee instability  HPI: Rodney Wiggins is a 28 y.o. male who complains of chronic left knee instability and pain.  He has been evaluated and worked up in the office and found to have a chronic ACL tear with medial and lateral meniscus pathology.  He presents today for arthroscopic ACL reconstruction with meniscus repair versus meniscectomy.  No new complaints at this time.  No new questions.  Past Medical History:  Diagnosis Date  . Acne   . Acute meniscal tear of left knee   . ADHD (attention deficit hyperactivity disorder)   . Complication of anesthesia    per pt was told during hand surgery 2010 was given BB during surgery for increased heart rate  . Irregular heart beat    per pt had stress test in high school to play football, was told when heart rate goes up irregularity resolved , per pt he has no issues with it  . Left ACL tear    Past Surgical History:  Procedure Laterality Date  . PERCUTANEOUS PINNING  2010  approx.   right hand   Social History   Socioeconomic History  . Marital status: Single    Spouse name: Not on file  . Number of children: Not on file  . Years of education: Not on file  . Highest education level: Not on file  Occupational History  . Not on file  Social Needs  . Financial resource strain: Not on file  . Food insecurity    Worry: Not on file    Inability: Not on file  . Transportation needs    Medical: Not on file    Non-medical: Not on file  Tobacco Use  . Smoking status: Never Smoker  . Smokeless tobacco: Never Used  Substance and Sexual Activity  . Alcohol use: Yes    Comment: socially  . Drug use: Never  . Sexual activity: Not on file  Lifestyle  . Physical activity    Days per week: Not on file    Minutes per session: Not on file  . Stress: Not on file  Relationships  . Social Musicianconnections    Talks  on phone: Not on file    Gets together: Not on file    Attends religious service: Not on file    Active member of club or organization: Not on file    Attends meetings of clubs or organizations: Not on file    Relationship status: Not on file  Other Topics Concern  . Not on file  Social History Narrative  . Not on file   History reviewed. No pertinent family history. No Known Allergies Prior to Admission medications   Medication Sig Start Date End Date Taking? Authorizing Provider  doxycycline (DORYX) 100 MG EC tablet Take 100 mg by mouth daily.   Yes [provider]  ibuprofen (ADVIL) 600 MG tablet Take 600 mg by mouth as needed.   Yes [provider]  amphetamine-dextroamphetamine (ADDERALL) 30 MG tablet Take 1 tablet by mouth daily.  04/16/17   [provider]   No results found.  Positive ROS: All other systems have been reviewed and were otherwise negative with the exception of those mentioned in the HPI and as above.  Physical Exam: General: Alert, no acute distress Cardiovascular: No pedal edema Respiratory: No cyanosis, no use of accessory musculature GI: No organomegaly,  abdomen is soft and non-tender Skin: No lesions in the area of chief complaint Neurologic: Sensation intact distally Psychiatric: Patient is competent for consent with normal mood and affect Lymphatic: No axillary or cervical lymphadenopathy  MUSCULOSKELETAL:  Left knee:  No open wounds or obvious deformities.  Neurovascular intact  Assessment: 1.  Left knee ACL tear 2.  Left knee medial meniscus tear 3.  Left knee lateral meniscus tear 4.  Left knee medial femoral condyle osteochondral lesion  Plan: -Plan for arthroscopic ACL reconstruction with hamstring autograft.  We will also assess the medial and lateral meniscus and repair if able versus meniscectomy.  Lastly, we will assess the cartilage injury to the medial condyle and plan for abrasion chondroplasty versus  microfracture if appropriate.  We have previously discussed the risk, benefits, and indications of this procedure at length.  He has provided informed consent to proceed today.  -Plan for discharge home postoperatively from PACU.    Nicholes Stairs, MD Cell [phone]    01/27/2019 8:22 AM

## 2019-01-27 NOTE — Brief Op Note (Signed)
01/27/2019  11:29 AM  PATIENT:  Laurence Compton  28 y.o. male  PRE-OPERATIVE DIAGNOSIS:  Left knee anterior cruciate ligament tear, medial and lateral meniscus tear  POST-OPERATIVE DIAGNOSIS:  Left knee anterior cruciate ligament tear, medial and lateral meniscus tear  PROCEDURE:  Procedure(s) with comments: Left knee arthroscopic anterior cruciate ligament reconstruction with hamstring autograft, partial medial and lateral menisectomies possible microfracture (Left) - 3 hrs  SURGEON:  Surgeon(s) and Role:    * Nicholes Stairs, MD - Primary  PHYSICIAN ASSISTANT:   ASSISTANTS: none   ANESTHESIA:   regional and general  EBL:  30 cc  BLOOD ADMINISTERED:none  DRAINS: none   LOCAL MEDICATIONS USED:  NONE  SPECIMEN:  No Specimen  DISPOSITION OF SPECIMEN:  N/A  COUNTS:  YES  TOURNIQUET:  * Missing tourniquet times found for documented tourniquets in log: [id number] *  DICTATION: .Note written in EPIC  PLAN OF CARE: Discharge to home after PACU  PATIENT DISPOSITION:  PACU - hemodynamically stable.   Delay start of Pharmacological VTE agent (>24hrs) due to surgical blood loss or risk of bleeding: not applicable

## 2019-01-28 ENCOUNTER — Encounter (HOSPITAL_BASED_OUTPATIENT_CLINIC_OR_DEPARTMENT_OTHER): Payer: Self-pay | Admitting: Orthopedic Surgery

## 2019-01-28 NOTE — Anesthesia Postprocedure Evaluation (Signed)
Anesthesia Post Note  Patient: Rodney Wiggins  Procedure(s) Performed: Left knee arthroscopic anterior cruciate ligament reconstruction with hamstring autograft, medial meniscus repair, possible microfracture (Left )     Patient location during evaluation: PACU Anesthesia Type: General Level of consciousness: awake and alert Pain management: pain level controlled Vital Signs Assessment: post-procedure vital signs reviewed and stable Respiratory status: spontaneous breathing, nonlabored ventilation and respiratory function stable Cardiovascular status: blood pressure returned to baseline and stable Postop Assessment: no apparent nausea or vomiting Anesthetic complications: no    Last Vitals:  Vitals:   01/27/19 1315 01/27/19 1500  BP:  125/79  Pulse: 84 86  Resp: 11 16  Temp:  36.8 C  SpO2: 94% 97%    Last Pain:  Vitals:   01/27/19 1500  TempSrc:   PainSc: [place]

## 2019-02-03 DIAGNOSIS — M25562 Pain in left knee: Secondary | ICD-10-CM | POA: Diagnosis not present

## 2019-02-07 DIAGNOSIS — T387X5A Adverse effect of androgens and anabolic congeners, initial encounter: Secondary | ICD-10-CM | POA: Diagnosis not present

## 2019-02-07 DIAGNOSIS — R7989 Other specified abnormal findings of blood chemistry: Secondary | ICD-10-CM | POA: Diagnosis not present

## 2019-02-10 ENCOUNTER — Ambulatory Visit: Payer: BC Managed Care – PPO | Attending: Orthopedic Surgery | Admitting: Physical Therapy

## 2019-02-10 ENCOUNTER — Other Ambulatory Visit: Payer: Self-pay

## 2019-02-10 ENCOUNTER — Encounter: Payer: Self-pay | Admitting: Physical Therapy

## 2019-02-10 DIAGNOSIS — R600 Localized edema: Secondary | ICD-10-CM | POA: Insufficient documentation

## 2019-02-10 DIAGNOSIS — M25662 Stiffness of left knee, not elsewhere classified: Secondary | ICD-10-CM | POA: Diagnosis not present

## 2019-02-10 DIAGNOSIS — M25562 Pain in left knee: Secondary | ICD-10-CM | POA: Diagnosis not present

## 2019-02-10 NOTE — Therapy (Signed)
[facility] [place], [place], [address] Phone: [phone]   Fax:  [phone]  Physical Therapy Evaluation  Patient Details  Name: Rodney Wiggins MRN: [id number] Date of Birth: 03/02/1991 Referring Provider (PT): Victorino December MD   Encounter Date: 02/10/2019  PT End of Session - 02/10/19 1702    Visit Number  1    Number of Visits  12    Date for PT Re-Evaluation  03/10/19    Authorization Type  PROGRESS NOTE AT 10TH VISIT.  KX MODIFIER AFTER 15 VISITS.    PT Start Time  0315    PT Stop Time  0416    PT Time Calculation (min)  61 min       Past Medical History:  Diagnosis Date  . Acne   . Acute medial meniscus tear of left knee 01/27/2019  . Acute meniscal tear of left knee   . ADHD (attention deficit hyperactivity disorder)   . Complete tear of anterior cruciate ligament of left knee 01/27/2019  . Complication of anesthesia    per pt was told during hand surgery 2010 was given BB during surgery for increased heart rate  . Irregular heart beat    per pt had stress test in high school to play football, was told when heart rate goes up irregularity resolved , per pt he has no issues with it  . Left ACL tear     Past Surgical History:  Procedure Laterality Date  . ANTERIOR CRUCIATE LIGAMENT REPAIR Left 01/27/2019   Procedure: Left knee arthroscopic anterior cruciate ligament reconstruction with hamstring autograft, medial meniscus repair, possible microfracture;  Surgeon: Nicholes Stairs, MD;  Location: [facility];  Service: Orthopedics;  Laterality: Left;  3 hrs  . PERCUTANEOUS PINNING  2010  approx.   right hand    There were no vitals filed for this visit.   Subjective Assessment - 02/10/19 1705    Subjective  COVID-19 screen performed prior to patient entering clinic.  The patient reported injuring his left knee about two years ago and he has been dealing with it.  He elected to undergo and  did so on 01/27/19.  He underwent a menicectomy and ACL reconstruction with hamstring autograft.  He is wearing a brace locked at 0 degrees.  His pain at rest is a 2/10 and higher when out of sling doing his HEP.  He states he has been doing SLR's, heel slides, flexion seated and using right knee to passive flex his left knee and prone SLR's.    Pertinent History  Hand surgery.    Patient Stated Goals  Get back to normal and workout.    Currently in Pain?  Yes    Pain Score  2     Pain Location  Knee    Pain Orientation  Left    Pain Descriptors / Indicators  Aching;Dull;Discomfort    Pain Type  Surgical pain    Pain Onset  1 to 4 weeks ago    Pain Frequency  Intermittent    Aggravating Factors   See above.    Pain Relieving Factors  See above.         [facility] PT Assessment - 02/10/19 0001      Assessment   Medical Diagnosis  Left knee scope with ACL reconstruction and hamstring autograft.    Referring Provider (PT)  Victorino December MD    Onset Date/Surgical Date  --   01/27/19 (surgery date).  Precautions   Precautions  --   PER PROTOCOL.   Required Braces or Orthoses  --   Left knee brace.     Restrictions   Weight Bearing Restrictions  --   WBAT over left LE with brace locked at 0 degrees.     Balance Screen   Has the patient fallen in the past 6 months  No    Has the patient had a decrease in activity level because of a fear of falling?   Yes    Is the patient reluctant to leave their home because of a fear of falling?   No      Prior Function   Level of Independence  Independent      Observation/Other Assessments   Observations  Left lateral incisional site (per patient) has "broke open).  It is closed at this time and appears to be healing well.  Monofilament stitch visible.  Very notable left quadriceps atrophy when contralaterally compared.    Focus on Therapeutic Outcomes (FOTO)   57% limitation.      Observation/Other Assessments-Edema    Edema  Circumferential       ROM / Strength   AROM / PROM / Strength  AROM;Strength      AROM   Overall AROM Comments  In supine the patien's left knee extension is -12 degrees and flexion= 95 degrees.      Strength   Overall Strength Comments  Decreased volitional contraction of his left quadriceps.      Palpation   Palpation comment  Patient not c/o any significant left knee pain.      Ambulation/Gait   Gait Comments  WBAT over his LE with knee brace locked in full extension.                Objective measurements completed on examination: See above findings.      [facility] Adult PT Treatment/Exercise - 02/10/19 0001      Exercises   Exercises  Knee/Hip      Knee/Hip Exercises: Supine   Quad Sets Limitations  15 minutes facilitated with Guern[place]ian e'stim to left quads with 10 sec contraction holds and 10 sec rest.      Modalities   Modalities  Electrical Stimulation;Vasopneumatic      Electrical Stimulation   Electrical Stimulation Location  Left knee    Electrical Stimulation Action  IFC    Electrical Stimulation Parameters  1-10 Hz x 15 minutes.    Electrical Stimulation Goals  Edema;Pain      Vasopneumatic   Number Minutes Vasopneumatic   15 minutes    Vasopnuematic Location   --   Left knee.   Vasopneumatic Pressure  Medium             PT Education - 02/10/19 1708    Education Details  [employer].    Person(s) Educated  Patient    Methods  Explanation;Demonstration    Comprehension  Verbalized understanding;Returned demonstration          PT Long Term Goals - 02/10/19 1729      PT LONG TERM GOAL #1   Title  Independent with a HEP.    Time  4    Period  Weeks    Status  New      PT LONG TERM GOAL #2   Title  Full active left knee extension in order to normalize gait.    Time  4    Period  Weeks    Status  New      PT LONG TERM GOAL #3   Title  Active knee flexion to 125 degrees+ so the patient can perform functional tasks and do so with pain not > 2-3/10.     Time  4    Period  Weeks    Status  New      PT LONG TERM GOAL #4   Title  Increase left knee strength to a solid 5/5 to provide good stability for accomplishment of functional activities.    Time  4    Period  Weeks    Status  New      PT LONG TERM GOAL #5   Title  Perform a reciprocating stair gait with one railing with pain not > 2-3/10.    Time  4    Period  Weeks    Status  New             Plan - 02/10/19 1722    Clinical Impression Statement  The patient present to OPPT s/p left knee arthroscopic surgery that included menicectomies and an ACL reconstruction with a hamstring autograft.  He has an expected amount of edema.  He is currently limited into both left knee flexion and extension.  He has a significnat loss of left quadriceps volitional contraction and obvious atrophy when contralaterally compared.  He walks with his left knee barce locked in full extension and he is wbat.  Patient will benefit from skilled physical therapy intervention to address deficits and pain.    Examination-Activity Limitations  Squat;Locomotion Level    Examination-Participation Restrictions  Other    Stability/Clinical Decision Making  Stable/Uncomplicated    Clinical Decision Making  Low    Rehab Potential  Excellent    PT Frequency  3x / week    PT Duration  4 weeks    PT Treatment/Interventions  ADLs/Self Care Home Management;Cryotherapy;Electrical Stimulation;Gait training;Stair training;Functional mobility training;Therapeutic activities;Therapeutic exercise;Neuromuscular re-education;Manual techniques;Patient/family education;Passive range of motion;Vasopneumatic Device    PT Next Visit Plan  Per protocol.  VMS/Russian to left quads.  Nustep with progression to stationary bike.  Vasopnuematic and electrical stimulation.    Consulted and Agree with Plan of Care  Patient       Patient will benefit from skilled therapeutic intervention in order to improve the following deficits and  impairments:  Abnormal gait, Decreased activity tolerance, Decreased range of motion, Decreased strength, Increased edema, Pain  Visit Diagnosis: 1. Acute pain of left knee   2. Stiffness of left knee, not elsewhere classified   3. Localized edema        Problem List Patient Active Problem List   Diagnosis Date Noted  . Complete tear of anterior cruciate ligament of left knee 01/27/2019  . Acute medial meniscus tear of left knee 01/27/2019  . Acute lateral meniscus tear of left knee 01/27/2019    Canyon Willow, [place] MPT 02/10/2019, 5:34 PM  [facility] [address] [place], [place], [address] Phone: [phone]   Fax:  [phone]  Name: Rodney MangesStephen Wiggins MRN: [id number] Date of Birth: [birth year]

## 2019-02-13 ENCOUNTER — Ambulatory Visit: Payer: BC Managed Care – PPO | Admitting: *Deleted

## 2019-02-19 ENCOUNTER — Ambulatory Visit: Payer: BC Managed Care – PPO | Admitting: Physical Therapy

## 2019-02-19 ENCOUNTER — Other Ambulatory Visit: Payer: Self-pay

## 2019-02-19 DIAGNOSIS — R600 Localized edema: Secondary | ICD-10-CM | POA: Diagnosis not present

## 2019-02-19 DIAGNOSIS — M25662 Stiffness of left knee, not elsewhere classified: Secondary | ICD-10-CM | POA: Diagnosis not present

## 2019-02-19 DIAGNOSIS — M25562 Pain in left knee: Secondary | ICD-10-CM | POA: Diagnosis not present

## 2019-02-19 NOTE — Therapy (Signed)
[facility] [address] [place], [place], [address] Phone: [phone]   Fax:  [phone]  Physical Therapy Treatment  Patient Details  Name: Rodney Wiggins MRN: [id number] Date of Birth: 06-10-1991 Referring Provider (PT): Duwayne HeckJason Rogers MD   Encounter Date: 02/19/2019  PT End of Session - 02/19/19 1134    Visit Number  2    Number of Visits  12    Date for PT Re-Evaluation  03/10/19    Authorization Type  PROGRESS NOTE AT 10TH VISIT.  KX MODIFIER AFTER 15 VISITS.    PT Start Time  1030    PT Stop Time  1125    PT Time Calculation (min)  55 min    Activity Tolerance  Patient tolerated treatment well    Behavior During Therapy  WFL for tasks assessed/performed       Past Medical History:  Diagnosis Date  . Acne   . Acute medial meniscus tear of left knee 01/27/2019  . Acute meniscal tear of left knee   . ADHD (attention deficit hyperactivity disorder)   . Complete tear of anterior cruciate ligament of left knee 01/27/2019  . Complication of anesthesia    per pt was told during hand surgery 2010 was given BB during surgery for increased heart rate  . Irregular heart beat    per pt had stress test in high school to play football, was told when heart rate goes up irregularity resolved , per pt he has no issues with it  . Left ACL tear     Past Surgical History:  Procedure Laterality Date  . ANTERIOR CRUCIATE LIGAMENT REPAIR Left 01/27/2019   Procedure: Left knee arthroscopic anterior cruciate ligament reconstruction with hamstring autograft, medial meniscus repair, possible microfracture;  Surgeon: Yolonda Kidaogers, Jason Patrick, MD;  [facility];  Service: Orthopedics;  Laterality: Left;  3 hrs  . PERCUTANEOUS PINNING  2010  approx.   right hand    There were no vitals filed for this visit.  Subjective Assessment - 02/19/19 1045    Subjective  COVID-19 screen performed prior to patient entering clinic. Pt  arriving in a neoprene brace reporting his Bobbye MortonDonjoy was too restricting.    Pertinent History  Hand surgery.    Currently in Pain?  No/denies         [facility] 0001      Assessment   Medical Diagnosis  Left knee scope with ACL reconstruction and hamstring autograft.    Referring Provider (PT)  Duwayne HeckJason Rogers MD      Precautions   Precautions  --   Per Protocol   Required Braces or Orthoses  --   [name] knee brace set from 0-90 degrees     Balance Screen   Has the patient fallen in the past 6 months  No      AROM   Overall AROM Comments  -5 degrees extension, 100 degrees flexion      Palpation   Palpation comment  TTP long lateral posterior hamstring tendons, with mild bruising noted of L LE      Ambulation/Gait   Gait Comments  WBAT over his LE with knee brace locked in full extension.   Pt wearing neoprene brace today and not his prescribed brace                  [facility] Adult PT Treatment/Exercise - 02/19/19 0001      Knee/Hip Exercises: Standing   Heel  Raises  Both;15 reps    Wall Squat  2 sets;10 reps   5-75 degrees flexion     Knee/Hip Exercises: Supine   Quad Sets Limitations  20 minutes neuromuscular fascilitation  with quadsets progressing to SAQ with [place] E-stim, 10 seconds on/off     Straight Leg Raises  Strengthening;Left;2 sets;10 reps      Modalities   Modalities  Cryotherapy;Electrical Stimulation;Vasopneumatic      Cryotherapy   Number Minutes Cryotherapy  10 Minutes    Cryotherapy Location  Knee    Type of Cryotherapy  Ice pack      Electrical Stimulation   Electrical Stimulation Location  Left knee    Electrical Stimulation Action  Russian 10 seconds on/off    Electrical Stimulation Parameters  20 minutes while performing QS and SAQ    Electrical Stimulation Goals  Neuromuscular facilitation      Vasopneumatic   Number Minutes Vasopneumatic   --   vasopneumatic device were used by other pt's     Manual  Therapy   Manual Therapy  Joint mobilization;Passive ROM    Manual therapy comments  PROM limited flexion per protocol    Joint Mobilization  patella mobs             PT Education - 02/19/19 1132    Education Details  Pt educated on importance of wearing his [name] knee brace that locks him in 0-90 degrees. Pt also instructed in new HEP progression.    Person(s) Educated  Patient    Methods  Explanation;Demonstration    Comprehension  Verbalized understanding;Returned demonstration          PT Long Term Goals - 02/19/19 1154      PT LONG TERM GOAL #1   Title  Independent with a HEP.    Time  4    Status  On-going      PT LONG TERM GOAL #2   Title  Full active left knee extension in order to normalize gait.    Time  4    Period  Weeks    Status  On-going      PT LONG TERM GOAL #3   Title  Active knee flexion to 125 degrees+ so the patient can perform functional tasks and do so with pain not > 2-3/10.    Time  4    Period  Weeks    Status  New      PT LONG TERM GOAL #4   Title  Increase left knee strength to a solid 5/5 to provide good stability for accomplishment of functional activities.    Time  4    Period  Weeks      PT LONG TERM GOAL #5   Title  Perform a reciprocating stair gait with one railing with pain not > 2-3/10.    Time  4    Period  Weeks    Status  New            Plan - 02/19/19 1149    Clinical Impression Statement  Pt presenting to therpay today with neoprene brace walking with normalized gait pattern. Pt non-compliant with wearing his prescribed [name] knee brace with limited flexion and extension from 0-90 degrees. Pt was edu on importance of wearing his brace provided by Dr. Stann Mainland. Pt reporting understanding. Pt also tolerated progression of his exercises with no pain reported. Pt did express tenderness to palpation of his L hamstring with mild bruising noted. Pt with normal respnse  to modalities. Continue with skilled PT following  pt's protocol.    Examination-Activity Limitations  Squat;Locomotion Level    Examination-Participation Restrictions  Other    Stability/Clinical Decision Making  Stable/Uncomplicated    PT Treatment/Interventions  ADLs/Self Care Home Management;Cryotherapy;Electrical Stimulation;Gait training;Stair training;Functional mobility training;Therapeutic activities;Therapeutic exercise;Neuromuscular re-education;Manual techniques;Patient/family education;Passive range of motion;Vasopneumatic Device    PT Next Visit Plan  Per protocol.  VMS/Russian to left quads.  Nustep with progression to stationary bike.  Vasopnuematic and electrical stimulation.    PT Home Exercise Plan  QS, prone knee hangs, hamstring stretch, patella mobs, SAQ, AAROM LAQ, SLR, mini wall squats    Consulted and Agree with Plan of Care  Patient       Patient will benefit from skilled therapeutic intervention in order to improve the following deficits and impairments:  Abnormal gait, Decreased activity tolerance, Decreased range of motion, Decreased strength, Increased edema, Pain  Visit Diagnosis: 1. Acute pain of left knee   2. Stiffness of left knee, not elsewhere classified   3. Localized edema        Problem List Patient Active Problem List   Diagnosis Date Noted  . Complete tear of anterior cruciate ligament of left knee 01/27/2019  . Acute medial meniscus tear of left knee 01/27/2019  . Acute lateral meniscus tear of left knee 01/27/2019    Sharmon LeydenJennifer R , PT 02/19/2019, 11:59 AM  [facility] [address] [place], [place], [address] Phone: [phone]   Fax:  [phone]  Name: Rodney Wiggins MRN: [id number] Date of Birth: Jan 09, 1991

## 2019-02-24 ENCOUNTER — Ambulatory Visit: Payer: BC Managed Care – PPO | Admitting: Physical Therapy

## 2019-03-04 ENCOUNTER — Encounter: Payer: Self-pay | Admitting: Physical Therapy

## 2019-03-04 ENCOUNTER — Other Ambulatory Visit: Payer: Self-pay

## 2019-03-04 ENCOUNTER — Ambulatory Visit: Payer: BC Managed Care – PPO | Admitting: Physical Therapy

## 2019-03-04 DIAGNOSIS — R600 Localized edema: Secondary | ICD-10-CM

## 2019-03-04 DIAGNOSIS — M25662 Stiffness of left knee, not elsewhere classified: Secondary | ICD-10-CM

## 2019-03-04 DIAGNOSIS — M25562 Pain in left knee: Secondary | ICD-10-CM

## 2019-03-04 NOTE — Therapy (Addendum)
[facility] [place], [place], [address] Phone: [phone]   Fax:  [phone]  Physical Therapy Treatment  Patient Details  Name: Rodney Wiggins MRN: [id number] Date of Birth: 31-May-1991 Referring Provider (PT): Victorino December MD   Encounter Date: 03/04/2019  PT End of Session - 03/04/19 1309     Visit Number  3    Number of Visits  12    Date for PT Re-Evaluation  03/10/19    Authorization Type  PROGRESS NOTE AT 10TH VISIT.  KX MODIFIER AFTER 15 VISITS.    PT Start Time  1303    PT Stop Time  1352    PT Time Calculation (min)  49 min    Activity Tolerance  Patient tolerated treatment well    Behavior During Therapy  WFL for tasks assessed/performed        Past Medical History:  Diagnosis Date   Acne    Acute medial meniscus tear of left knee 01/27/2019   Acute meniscal tear of left knee    ADHD (attention deficit hyperactivity disorder)    Complete tear of anterior cruciate ligament of left knee 5/46/5035   Complication of anesthesia    per pt was told during hand surgery 2010 was given BB during surgery for increased heart rate   Irregular heart beat    per pt had stress test in high school to play football, was told when heart rate goes up irregularity resolved , per pt he has no issues with it   Left ACL tear     Past Surgical History:  Procedure Laterality Date   ANTERIOR CRUCIATE LIGAMENT REPAIR Left 01/27/2019   Procedure: Left knee arthroscopic anterior cruciate ligament reconstruction with hamstring autograft, medial meniscus repair, possible microfracture;  Surgeon: Nicholes Stairs, MD;  Location: [place];  Service: Orthopedics;  Laterality: Left;  3 hrs   PERCUTANEOUS PINNING  2010  approx.   right hand    There were no vitals filed for this visit.  Subjective Assessment - 03/04/19 1308     Subjective  COVID-19 screen performed prior to patient entering clinic. Reports that  he returns to MD next week.    Pertinent History  Hand surgery.    Patient Stated Goals  Get back to normal and workout.    Currently in Pain?  No/denies          [facility] PT Assessment - 03/04/19 0001       Assessment   Medical Diagnosis  Left knee scope with ACL reconstruction and hamstring autograft.    Referring Provider (PT)  Victorino December MD    Onset Date/Surgical Date  01/27/19    Next MD Visit  03/11/2019      ROM / Strength   AROM / PROM / Strength  AROM      AROM   Overall AROM   Within functional limits for tasks performed    AROM Assessment Site  Knee    Right/Left Knee  Left    Left Knee Extension  0    Left Knee Flexion  134                    [facility] Adult PT Treatment/Exercise - 03/04/19 0001       Knee/Hip Exercises: Aerobic   Nustep  L6, seat 10 x10 min      Knee/Hip Exercises: Standing   Terminal Knee Extension  Strengthening;Left;2 sets;10 reps;Theraband    Theraband Level (  Terminal Knee Extension)  Level 2 (Red)    Lateral Step Up  Left;2 sets;10 reps;Hand Hold: 2;Step Height: 6"    Forward Step Up  Left;2 sets;10 reps;Hand Hold: 2;Step Height: 6"    Rocker Board  2 minutes      Knee/Hip Exercises: Supine   Straight Leg Raises  Strengthening;Left;2 sets;10 reps    Straight Leg Raise with External Rotation  Strengthening;Left;2 sets;10 reps      Knee/Hip Exercises: Sidelying   Hip ABduction  Strengthening;Left;2 sets;10 reps      Knee/Hip Exercises: Prone   Hip Extension  Strengthening;Left;2 sets;10 reps      Modalities   Modalities  Vasopneumatic      Vasopneumatic   Number Minutes Vasopneumatic   15 minutes    Vasopnuematic Location   Knee    Vasopneumatic Pressure  Medium    Vasopneumatic Temperature   34                   PT Long Term Goals - 03/04/19 1342       PT LONG TERM GOAL #1   Title  Independent with a HEP.    Time  4    Status  On-going      PT LONG TERM GOAL #2   Title  Full active left knee  extension in order to normalize gait.    Time  4    Period  Weeks    Status  Achieved      PT LONG TERM GOAL #3   Title  Active knee flexion to 125 degrees+ so the patient can perform functional tasks and do so with pain not > 2-3/10.    Time  4    Period  Weeks    Status  Achieved      PT LONG TERM GOAL #4   Title  Increase left knee strength to a solid 5/5 to provide good stability for accomplishment of functional activities.    Time  4    Period  Weeks    Status  On-going      PT LONG TERM GOAL #5   Title  Perform a reciprocating stair gait with one railing with pain not > 2-3/10.    Time  4    Period  Weeks    Status  Partially Met             Plan - 03/04/19 1342     Clinical Impression Statement  Patient presented in clinic with no L knee pain but continues to report tightness. Patient compliant with icing for inflammation and edema control. Patient presented with neoprene brace donned today and all exercises completed with brace donned. Only reports of fatigue noted during therex especially SLR and SLR with ER. Patient's L knee AROM 0-134 deg. Muscle tone deficient in L quad especially. Normal vasopnuematic response noted following removal of the modality.    Examination-Activity Limitations  Squat;Locomotion Level    Examination-Participation Restrictions  Other    Stability/Clinical Decision Making  Stable/Uncomplicated    Rehab Potential  Excellent    PT Frequency  3x / week    PT Duration  4 weeks    PT Treatment/Interventions  ADLs/Self Care Home Management;Cryotherapy;Electrical Stimulation;Gait training;Stair training;Functional mobility training;Therapeutic activities;Therapeutic exercise;Neuromuscular re-education;Manual techniques;Patient/family education;Passive range of motion;Vasopneumatic Device    PT Next Visit Plan  Per protocol.  VMS/Russian to left quads.  Nustep with progression to stationary bike.  Vasopnuematic and electrical stimulation.    PT  Home Exercise Plan  QS, prone knee hangs, hamstring stretch, patella mobs, SAQ, AAROM LAQ, SLR, mini wall squats    Consulted and Agree with Plan of Care  Patient        Patient will benefit from skilled therapeutic intervention in order to improve the following deficits and impairments:  Abnormal gait, Decreased activity tolerance, Decreased range of motion, Decreased strength, Increased edema, Pain  Visit Diagnosis: Acute pain of left knee  Stiffness of left knee, not elsewhere classified  Localized edema     Problem List Patient Active Problem List   Diagnosis Date Noted   Complete tear of anterior cruciate ligament of left knee 01/27/2019   Acute medial meniscus tear of left knee 01/27/2019   Acute lateral meniscus tear of left knee 01/27/2019    Standley Brooking, PTA 03/04/2019, 2:53 PM  [facility] [address] [place], [place], [address] Phone: [phone]   Fax:  [phone]  Name: Rodney Wiggins MRN: [id number] Date of Birth: 08-Oct-1990  PHYSICAL THERAPY DISCHARGE SUMMARY  Visits from Start of Care: 3.  Current functional level related to goals / functional outcomes: See above.   Remaining deficits: See below.   Education / Equipment: HEP.   Patient agrees to discharge. Patient goals were not met. Patient is being discharged due to not returning since the last visit.     Mali Applegate MPT

## 2019-05-09 DIAGNOSIS — M25562 Pain in left knee: Secondary | ICD-10-CM | POA: Diagnosis not present

## 2019-08-05 DIAGNOSIS — F909 Attention-deficit hyperactivity disorder, unspecified type: Secondary | ICD-10-CM | POA: Diagnosis not present

## 2019-08-05 DIAGNOSIS — F411 Generalized anxiety disorder: Secondary | ICD-10-CM | POA: Diagnosis not present

## 2019-08-05 DIAGNOSIS — S134XXA Sprain of ligaments of cervical spine, initial encounter: Secondary | ICD-10-CM | POA: Diagnosis not present

## 2019-08-05 DIAGNOSIS — S338XXA Sprain of other parts of lumbar spine and pelvis, initial encounter: Secondary | ICD-10-CM | POA: Diagnosis not present

## 2019-08-05 DIAGNOSIS — S233XXA Sprain of ligaments of thoracic spine, initial encounter: Secondary | ICD-10-CM | POA: Diagnosis not present

## 2019-08-05 DIAGNOSIS — Z87891 Personal history of nicotine dependence: Secondary | ICD-10-CM | POA: Diagnosis not present

## 2019-08-05 DIAGNOSIS — F410 Panic disorder [episodic paroxysmal anxiety] without agoraphobia: Secondary | ICD-10-CM | POA: Diagnosis not present

## 2019-08-09 DIAGNOSIS — Z20822 Contact with and (suspected) exposure to covid-19: Secondary | ICD-10-CM | POA: Diagnosis not present

## 2019-12-13 ENCOUNTER — Emergency Department (HOSPITAL_COMMUNITY)
Admission: EM | Admit: 2019-12-13 | Discharge: 2019-12-13 | Disposition: A | Discharge status: Home / Self Care | Payer: Self-pay | Attending: Emergency Medicine | Admitting: Emergency Medicine

## 2019-12-13 ENCOUNTER — Encounter (HOSPITAL_COMMUNITY): Payer: Self-pay

## 2019-12-13 ENCOUNTER — Other Ambulatory Visit: Payer: Self-pay

## 2019-12-13 DIAGNOSIS — M791 Myalgia, unspecified site: Secondary | ICD-10-CM | POA: Insufficient documentation

## 2019-12-13 DIAGNOSIS — R509 Fever, unspecified: Secondary | ICD-10-CM | POA: Insufficient documentation

## 2019-12-13 DIAGNOSIS — T8090XA Unspecified complication following infusion and therapeutic injection, initial encounter: Secondary | ICD-10-CM | POA: Insufficient documentation

## 2019-12-13 DIAGNOSIS — Y69 Unspecified misadventure during surgical and medical care: Secondary | ICD-10-CM | POA: Insufficient documentation

## 2019-12-13 DIAGNOSIS — Z20822 Contact with and (suspected) exposure to covid-19: Secondary | ICD-10-CM | POA: Insufficient documentation

## 2019-12-13 LAB — CBC WITH DIFFERENTIAL/PLATELET
Abs Immature Granulocytes: 0.08 10*3/uL — ABNORMAL HIGH (ref 0.00–0.07)
Basophils Absolute: 0.1 10*3/uL (ref 0.0–0.1)
Basophils Relative: 0 %
Eosinophils Absolute: 0.3 10*3/uL (ref 0.0–0.5)
Eosinophils Relative: 2 %
HCT: 44 % (ref 39.0–52.0)
Hemoglobin: 14.4 g/dL (ref 13.0–17.0)
Immature Granulocytes: 1 %
Lymphocytes Relative: 6 %
Lymphs Abs: 1 10*3/uL (ref 0.7–4.0)
MCH: 31.2 pg (ref 26.0–34.0)
MCHC: 32.7 g/dL (ref 30.0–36.0)
MCV: 95.2 fL (ref 80.0–100.0)
Monocytes Absolute: 1.8 10*3/uL — ABNORMAL HIGH (ref 0.1–1.0)
Monocytes Relative: 11 %
Neutro Abs: 12.8 10*3/uL — ABNORMAL HIGH (ref 1.7–7.7)
Neutrophils Relative %: 80 %
Platelets: 328 10*3/uL (ref 150–400)
RBC: 4.62 MIL/uL (ref 4.22–5.81)
RDW: 12.6 % (ref 11.5–15.5)
WBC: 16.1 10*3/uL — ABNORMAL HIGH (ref 4.0–10.5)
nRBC: 0 % (ref 0.0–0.2)

## 2019-12-13 LAB — COMPREHENSIVE METABOLIC PANEL
ALT: 31 U/L (ref 0–44)
AST: 24 U/L (ref 15–41)
Albumin: 3.4 g/dL — ABNORMAL LOW (ref 3.5–5.0)
Alkaline Phosphatase: 71 U/L (ref 38–126)
Anion gap: 7 (ref 5–15)
BUN: 12 mg/dL (ref 6–20)
CO2: 24 mmol/L (ref 22–32)
Calcium: 9 mg/dL (ref 8.9–10.3)
Chloride: 105 mmol/L (ref 98–111)
Creatinine, Ser: 1.01 mg/dL (ref 0.61–1.24)
GFR calc Af Amer: >60 mL/min (ref >60)
GFR calc non Af Amer: >60 mL/min (ref >60)
Glucose, Bld: 101 mg/dL — ABNORMAL HIGH (ref 70–99)
Potassium: 4.4 mmol/L (ref 3.5–5.1)
Sodium: 136 mmol/L (ref 135–145)
Total Bilirubin: 0.4 mg/dL (ref 0.3–1.2)
Total Protein: 6.7 g/dL (ref 6.5–8.1)

## 2019-12-13 LAB — SARS CORONAVIRUS 2 BY RT PCR (HOSPITAL ORDER, PERFORMED IN ~~LOC~~ HOSPITAL LAB): SARS Coronavirus 2: NEGATIVE

## 2019-12-13 MED ORDER — SODIUM CHLORIDE 0.9% FLUSH: 3.0000 mL | Freq: Once | INTRAVENOUS | Status: DC

## 2019-12-13 MED ORDER — SULFAMETHOXAZOLE-TRIMETHOPRIM 800-160 MG PO TABS: 1.0000 | ORAL_TABLET | Freq: Two times a day (BID) | ORAL | 0 refills | Status: AC

## 2019-12-13 NOTE — ED Notes (Signed)
Patient Alert and oriented to baseline. Stable and ambulatory to baseline. Patient verbalized understanding of the discharge instructions.  Patient belongings were taken by the patient.   

## 2019-12-13 NOTE — ED Triage Notes (Signed)
Patient complains of right buttock pain since lst night with discomfort. Currently receiving test injections for work and finishing treatment for cellulitis. Unsure if related to either

## 2019-12-13 NOTE — ED Notes (Signed)
Pt called for triage x 1 no answer 

## 2019-12-13 NOTE — ED Notes (Signed)
Pt called for the second time for triage no response.

## 2019-12-13 NOTE — ED Provider Notes (Addendum)
[facility] EMERGENCY DEPARTMENT Provider Note   CSN: [id number] Arrival date & time: 12/13/19  1031     History No chief complaint on file.   Rodney Wiggins is a 29 y.o. male presents to the ER for evaluation of low-grade fever upon waking up this morning 100.4.  He woke up and felt "off", generalized body aches.  He felt subjective fevers and decided to check his temperature.  States on route to the ER he started feeling a lot better.  Currently feels fine.  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  1 week ago he injected 300 mg of testosterone enanthate into his left chest and developed some redness and tenderness.  He went to urgent care on 6/1 and was prescribed Keflex for cellulitis.  He has been compliant with this and states his symptoms have almost completely resolved.  He injected 300 mg of testosterone on to his right buttock 2 days ago and reports some swelling in the injection area with tenderness.  States when he injects testosterone the area will get slightly swollen and tender but states this feels a little bit more than usual.  His pain in the area is very mild.  Patient uses new needles with every injection.  He is not sure if maybe he injected the testosterone and correctly or did not rub it enough this time.  He otherwise has no other localizing symptoms of infection including headaches, nasal congestion, sore throat, cough, vomiting, diarrhea.  No chest pain or shortness of breath.  No dysuria.  He is otherwise very healthy.  States he did not get Covid vaccine but admits he usually does not wear a mask in public. HPI     Past Medical History:  Diagnosis Date  . Acne   . Acute medial meniscus tear of left knee 01/27/2019  . Acute meniscal tear of left knee   . ADHD (attention deficit hyperactivity disorder)   . Complete tear of anterior cruciate ligament of left knee 01/27/2019  . Complication of  anesthesia    per pt was told during hand surgery 2010 was given BB during surgery for increased heart rate  . Irregular heart beat    per pt had stress test in high school to play football, was told when heart rate goes up irregularity resolved , per pt he has no issues with it  . Left ACL tear     Patient Active Problem List   Diagnosis Date Noted  . Complete tear of anterior cruciate ligament of left knee 01/27/2019  . Acute medial meniscus tear of left knee 01/27/2019  . Acute lateral meniscus tear of left knee 01/27/2019    Past Surgical History:  Procedure Laterality Date  . ANTERIOR CRUCIATE LIGAMENT REPAIR Left 01/27/2019   Procedure: Left knee arthroscopic anterior cruciate ligament reconstruction with hamstring autograft, medial meniscus repair, possible microfracture;  Surgeon: Yolonda Kida, MD;  Location: [facility];  Service: Orthopedics;  Laterality: Left;  3 hrs  . PERCUTANEOUS PINNING  2010  approx.   right hand       No family history on file.  Social History   Tobacco Use  . Smoking status: Never Smoker  . Smokeless tobacco: Never Used  Substance Use Topics  . Alcohol use: Yes    Comment: socially  . Drug use: Never    Home Medications Prior to Admission medications   Medication Sig Start Date End  Date Taking? Authorizing Provider  amphetamine-dextroamphetamine (ADDERALL) 30 MG tablet Take 1 tablet by mouth daily.  04/16/17   [provider]  doxycycline (DORYX) 100 MG EC tablet Take 100 mg by mouth daily.    [provider]  ibuprofen (ADVIL) 600 MG tablet Take 600 mg by mouth as needed.    [provider]  ondansetron (ZOFRAN ODT) 4 MG disintegrating tablet Take 1 tablet (4 mg total) by mouth every 8 (eight) hours as needed for nausea or vomiting. Patient not taking: Reported on 02/10/2019 01/27/19   Nicholes Stairs, MD  sulfamethoxazole-trimethoprim (BACTRIM DS) 800-160 MG tablet Take 1 tablet by  mouth 2 (two) times daily for 7 days. 12/13/19 12/20/19  Kinnie Feil, PA-C    Allergies    Patient has no known allergies.  Review of Systems   Review of Systems  Constitutional: Positive for fever.  Musculoskeletal: Positive for myalgias.  All other systems reviewed and are negative.   Physical Exam Updated Vital Signs BP (!) 145/49 (BP Location: Right Arm)   Pulse (!) 50   Temp 98.1 F (36.7 C) (Oral)   Resp 14   Ht 5\' 7"  (1.702 m)   Wt 81.6 kg   SpO2 98%   BMI 28.19 kg/m   Physical Exam Vitals and nursing note reviewed.  Constitutional:      General: He is not in acute distress.    Appearance: He is well-developed.     Comments: NAD.  HENT:     Head: Normocephalic and atraumatic.     Right Ear: External ear normal.     Left Ear: External ear normal.     Nose: Nose normal.  Eyes:     General: No scleral icterus.    Conjunctiva/sclera: Conjunctivae normal.  Cardiovascular:     Rate and Rhythm: Normal rate and regular rhythm.     Heart sounds: Normal heart sounds.  Pulmonary:     Effort: Pulmonary effort is normal.     Breath sounds: Normal breath sounds.  Musculoskeletal:        General: Normal range of motion.     Cervical back: Normal range of motion and neck supple.  Skin:    General: Skin is warm and dry.     Capillary Refill: Capillary refill takes less than 2 seconds.          Comments: Anterior chest wall skin normal, no cellulitis, lesions, abscess, tenderness Small area of very mild induration, tenderness on right upper buttock. I can see where he injected, tender.  No fluctuance, erythema, warmth, lesions.   Neurological:     Mental Status: He is alert and oriented to person, place, and time.  Psychiatric:        Behavior: Behavior normal.        Thought Content: Thought content normal.        Judgment: Judgment normal.     ED Results / Procedures / Treatments   Labs (all labs ordered are listed, but only abnormal results are  displayed) Labs Reviewed  COMPREHENSIVE METABOLIC PANEL - Abnormal; Notable for the following components:      Result Value   Glucose, Bld 101 (*)    Albumin 3.4 (*)    All other components within normal limits  CBC WITH DIFFERENTIAL/PLATELET - Abnormal; Notable for the following components:   WBC 16.1 (*)    Neutro Abs 12.8 (*)    Monocytes Absolute 1.8 (*)    Abs Immature Granulocytes 0.08 (*)  All other components within normal limits  SARS CORONAVIRUS 2 BY RT PCR (HOSPITAL ORDER, PERFORMED IN [facility])    EKG None  Radiology No results found.  Procedures Procedures (including critical care time)  Medications Ordered in ED Medications  sodium chloride flush (NS) 0.9 % injection 3 mL (3 mLs Intravenous Not Given 12/13/19 1209)    ED Course  I have reviewed the triage vital signs and the nursing notes.  Pertinent labs & imaging results that were available during my care of the patient were reviewed by me and considered in my medical decision making (see chart for details).    MDM Rules/Calculators/A&P                      29 year old healthy male presents to the ER for low-grade fever, body aches and generalized malaise.  Symptoms are mild.  He is a Pharmacist, community and recently injected testosterone IM on right buttocks.  States this area is swollen and tender, more than usual after his injections.  Exam is benign.  Nontoxic-appearing.  Afebrile.  Right buttock is very mildly tender and indurated, consistent with local injection site reaction but not consistent with cellulitis, abscess.  Patient is already taking Keflex for chest wall cellulitis as prescribed by urgent care on 6/1.  Patient is now received Covid vaccine and does not wear a mask in public.  I do not think his generalized malaise, myalgias, fever are from the testosterone injection, cellulitis, abscess but instead probably from an early infection without any localizing symptoms yet.  We will  send a Covid test.  I explained to patient that we will change Keflex to Bactrim to expand coverage for MRSA, just in case, although clinically I do not think there is a significant skin infection.  He will be discharged and follow-up on his Covid test in 2 hours.  Recommended he monitor for any other symptoms that may develop that could point to an infection.  Technically testosterone puts him at risk for PE which was considered given low grade fever but no CP, SOB, tachycardia, hypoxia here and unlike.y   Recommended fluids, NSAIDs.  Return precautions discussed.  He is comfortable with this. Final Clinical Impression(s) / ED Diagnoses Final diagnoses:  Low grade fever  Myalgia  Injection site reaction, initial encounter    Rx / DC Orders ED Discharge Orders         Ordered    sulfamethoxazole-trimethoprim (BACTRIM DS) 800-160 MG tablet  2 times daily     12/13/19 1318           Liberty Handy, [place] 12/13/19 1316    Liberty Handy, PA-C 12/13/19 1319    Virgina Norfolk, DO 12/13/19 1322

## 2019-12-13 NOTE — Discharge Instructions (Addendum)
You were seen in the ER for low grade fever, body aches  I do not think fever is from injection site infection or abscess or cellulitis. Alternate ibuprofen and/or acetaminophen every 6-8 hours to inflammation, pain swelling.   We tested you for COVID, results come back in 2 hours. Check MyCHart for results.   Return for worsening or high fever, new infection symptoms like fever, cough, chest pain, shortness of breath, abdominal pain, vomiting, diarrhea, urinary symptoms, worsening swelling redness or boil to skin around injection

## 2020-04-15 DIAGNOSIS — S134XXA Sprain of ligaments of cervical spine, initial encounter: Secondary | ICD-10-CM | POA: Diagnosis not present

## 2020-04-15 DIAGNOSIS — S338XXA Sprain of other parts of lumbar spine and pelvis, initial encounter: Secondary | ICD-10-CM | POA: Diagnosis not present

## 2020-04-15 DIAGNOSIS — S233XXA Sprain of ligaments of thoracic spine, initial encounter: Secondary | ICD-10-CM | POA: Diagnosis not present

## 2020-04-22 DIAGNOSIS — M25562 Pain in left knee: Secondary | ICD-10-CM | POA: Diagnosis not present

## 2020-07-23 ENCOUNTER — Ambulatory Visit (INDEPENDENT_AMBULATORY_CARE_PROVIDER_SITE_OTHER): Payer: BC Managed Care – PPO | Admitting: Otolaryngology

## 2020-07-23 ENCOUNTER — Other Ambulatory Visit: Payer: Self-pay

## 2020-07-23 ENCOUNTER — Encounter (INDEPENDENT_AMBULATORY_CARE_PROVIDER_SITE_OTHER): Payer: Self-pay | Admitting: Otolaryngology

## 2020-07-23 VITALS — Temp 97.7°F

## 2020-07-23 DIAGNOSIS — L299 Pruritus, unspecified: Secondary | ICD-10-CM

## 2020-07-23 DIAGNOSIS — H6123 Impacted cerumen, bilateral: Secondary | ICD-10-CM

## 2020-07-23 NOTE — Progress Notes (Signed)
HPI: Azarius Lambson is a 30 y.o. male who presents for evaluation of chronic itching in both ears as well as wax buildup in his ears.  He has cleaned his ears previously with hydroperoxide is also used over-the-counter eardrops for itching in the ears.  He has had itching in his ears for a number of years.  He wanted his ears checked and cleaned.  Past Medical History:  Diagnosis Date  . Acne   . Acute medial meniscus tear of left knee 01/27/2019  . Acute meniscal tear of left knee   . ADHD (attention deficit hyperactivity disorder)   . Complete tear of anterior cruciate ligament of left knee 01/27/2019  . Complication of anesthesia    per pt was told during hand surgery 2010 was given BB during surgery for increased heart rate  . Irregular heart beat    per pt had stress test in high school to play football, was told when heart rate goes up irregularity resolved , per pt he has no issues with it  . Left ACL tear    Past Surgical History:  Procedure Laterality Date  . ANTERIOR CRUCIATE LIGAMENT REPAIR Left 01/27/2019   Procedure: Left knee arthroscopic anterior cruciate ligament reconstruction with hamstring autograft, medial meniscus repair, possible microfracture;  Surgeon: Yolonda Kida, MD;  Location: [facility];  Service: Orthopedics;  Laterality: Left;  3 hrs  . PERCUTANEOUS PINNING  2010  approx.   right hand   Social History   Socioeconomic History  . Marital status: Married    Spouse name: Not on file  . Number of children: Not on file  . Years of education: Not on file  . Highest education level: Not on file  Occupational History  . Not on file  Tobacco Use  . Smoking status: Never Smoker  . Smokeless tobacco: Current User    Types: Chew  . Tobacco comment: every other day  Vaping Use  . Vaping Use: Never used  Substance and Sexual Activity  . Alcohol use: Yes    Comment: socially  . Drug use: Never  . Sexual activity: Not on file  Other  Topics Concern  . Not on file  Social History Narrative  . Not on file   Social Determinants of Health   Financial Resource Strain: Not on file  Food Insecurity: Not on file  Transportation Needs: Not on file  Physical Activity: Not on file  Stress: Not on file  Social Connections: Not on file   No family history on file. No Known Allergies Prior to Admission medications   Medication Sig Start Date End Date Taking? Authorizing Provider  amphetamine-dextroamphetamine (ADDERALL) 30 MG tablet Take 1 tablet by mouth daily.  04/16/17   [provider]  doxycycline (DORYX) 100 MG EC tablet Take 100 mg by mouth daily.    [provider]  ibuprofen (ADVIL) 600 MG tablet Take 600 mg by mouth as needed.    [provider]  ondansetron (ZOFRAN ODT) 4 MG disintegrating tablet Take 1 tablet (4 mg total) by mouth every 8 (eight) hours as needed for nausea or vomiting. Patient not taking: Reported on 02/10/2019 01/27/19   Yolonda Kida, MD     Positive ROS: Otherwise negative  All other systems have been reviewed and were otherwise negative with the exception of those mentioned in the HPI and as above.  Physical Exam: Constitutional: Alert, well-appearing, no acute distress Ears: External ears without lesions or tenderness.  He  has crusting and scabbing in both ears.  Crusting is worse in the left ear canal.  However the right ear canal has more scabbing of the lateral skin in the right ear canal.  After cleaning the ear canals both TMs were clear.  There is no evidence of external otitis.  And TMs are clear with no crusting.  I applied gentian violet to the lateral right ear canal. Nasal: External nose without lesions.. Clear nasal passages Oral: Lips and gums without lesions. Tongue and palate mucosa without lesions. Posterior oropharynx clear. Neck: No palpable adenopathy or masses Respiratory: Breathing comfortably  Skin: No facial/neck lesions or rash  noted.  Cerumen impaction removal  Date/Time: 07/23/2020 5:38 PM Performed by: Drema Halon, MD Authorized by: Drema Halon, MD   Consent:    Consent obtained:  Verbal   Consent given by:  Patient   Risks discussed:  Pain and bleeding Procedure details:    Location:  L ear and R ear   Procedure type: curette, suction and forceps   Post-procedure details:    Inspection:  TM intact and canal normal   Hearing quality:  Improved   Patient tolerance of procedure:  Tolerated well, no immediate complications Comments:     Crusting and scabbing was cleaned from both ear canals.  I applied some gentian violet to the right lateral ear canal only.    Assessment: Wax buildup and crusting in both ear canals.  Plan: Prescribed Diprolene 0.05% cream or lotion to apply to the ear canal twice daily for 5 days as needed itching. He will follow-up as needed.  Narda Bonds, MD

## 2020-08-09 ENCOUNTER — Other Ambulatory Visit: Payer: Self-pay

## 2020-08-09 ENCOUNTER — Encounter (INDEPENDENT_AMBULATORY_CARE_PROVIDER_SITE_OTHER): Payer: Self-pay | Admitting: Internal Medicine

## 2020-08-09 ENCOUNTER — Ambulatory Visit (INDEPENDENT_AMBULATORY_CARE_PROVIDER_SITE_OTHER): Payer: BC Managed Care – PPO | Admitting: Internal Medicine

## 2020-08-09 VITALS — BP 110/66 | HR 83 | Temp 97.7°F | Ht 67.0 in | Wt 190.8 lb

## 2020-08-09 DIAGNOSIS — E785 Hyperlipidemia, unspecified: Secondary | ICD-10-CM | POA: Diagnosis not present

## 2020-08-09 DIAGNOSIS — E559 Vitamin D deficiency, unspecified: Secondary | ICD-10-CM | POA: Diagnosis not present

## 2020-08-09 NOTE — Progress Notes (Signed)
Metrics: Intervention Frequency ACO  Documented Smoking Status Yearly  Screened one or more times in 24 months  Cessation Counseling or  Active cessation medication Past 24 months  Past 24 months   Guideline developer: UpToDate (See UpToDate for funding source) Date Released: 2014       Wellness Office Visit  Subjective:  Patient ID: Rodney Wiggins, male    DOB: 01-Mar-1991  Age: 30 y.o. MRN: [id number]  CC: This 30 year old man comes to our practice as a new patient for wellness. HPI  He tries to eat healthy and works out in the gym 5 times a week.  On closer questioning, he also admitted that he takes testosterone from the black market and he injects himself once a week.  He also apparently takes anastrozole to suppress estrogen levels. I do not know if his testosterone levels were suboptimal. He does express desire to have children in the future. I have reviewed lab work from his primary care physician that was done in November 2021. Past Medical History:  Diagnosis Date  . Acne   . Acute medial meniscus tear of left knee 01/27/2019  . Acute meniscal tear of left knee   . ADHD (attention deficit hyperactivity disorder)   . Complete tear of anterior cruciate ligament of left knee 01/27/2019  . Complication of anesthesia    per pt was told during hand surgery 2010 was given BB during surgery for increased heart rate  . Irregular heart beat    per pt had stress test in high school to play football, was told when heart rate goes up irregularity resolved , per pt he has no issues with it  . Left ACL tear    Past Surgical History:  Procedure Laterality Date  . ANTERIOR CRUCIATE LIGAMENT REPAIR Left 01/27/2019   Procedure: Left knee arthroscopic anterior cruciate ligament reconstruction with hamstring autograft, medial meniscus repair, possible microfracture;  Surgeon: Yolonda Kida, MD;  Location: [facility];  Service: Orthopedics;  Laterality: Left;  3 hrs  .  PERCUTANEOUS PINNING  2010  approx.   right hand     History reviewed. No pertinent family history.  Social History   Social History Narrative   Single ,lives with girlfriend.Drives a truck,delivering beer.Exercises 5 times a week.   Social History   Tobacco Use  . Smoking status: Never Smoker  . Smokeless tobacco: Current User    Types: Chew  . Tobacco comment: every other day  Substance Use Topics  . Alcohol use: Yes    Comment: socially    Current Meds  Medication Sig  . ALPRAZolam (XANAX) 0.5 MG tablet Take 0.5 mg by mouth at bedtime as needed for anxiety.  Marland Kitchen amphetamine-dextroamphetamine (ADDERALL) 30 MG tablet Take 1 tablet by mouth daily.   Marland Kitchen augmented betamethasone dipropionate (DIPROLENE-AF) 0.05 % cream Apply 1 application topically 2 (two) times daily.  . clotrimazole-betamethasone (LOTRISONE) cream SMARTSIG:1 Topical Every Night  . escitalopram (LEXAPRO) 10 MG tablet Take 10 mg by mouth daily.  . [DISCONTINUED] ibuprofen (ADVIL) 600 MG tablet Take 600 mg by mouth as needed.      Depression screen [facility] 2/9 08/09/2020  Decreased Interest 0  Down, Depressed, Hopeless 0  PHQ - 2 Score 0  Altered sleeping 0  Tired, decreased energy 0  Change in appetite 0  Feeling bad or failure about yourself  0  Trouble concentrating 0  Moving slowly or fidgety/restless 0  Suicidal thoughts 0  PHQ-9 Score 0  Difficult doing  work/chores Not difficult at all     Objective:   Today's Vitals: BP 110/66   Pulse 83   Temp 97.7 F (36.5 C) (Temporal)   Ht 5\' 7"  (1.702 m)   Wt 190 lb 12.8 oz (86.5 kg)   SpO2 96%   BMI 29.88 kg/m  Vitals with BMI 08/09/2020 12/13/2019 12/13/2019  Height 5\' 7"  - 5\' 7"   Weight 190 lbs 13 oz - 180 lbs  BMI 29.88 - 28.19  Systolic 110 116 02/12/2020  Diastolic 66 52 49  Pulse 83 58 50     Physical Exam Healthy appearing man.      Assessment   1. Dyslipidemia   2. Vitamin D deficiency disease       Tests ordered Orders Placed This  Encounter  Procedures  . VITAMIN D 25 Hydroxy (Vit-D Deficiency, Fractures)     Plan: 1. We will check vitamin D levels which were not checked before. 2. From the lab work that I reviewed, he does have very mild dyslipidemia with increased LDL levels. 3. I have strongly encouraged him at this point in his life to discontinue all testosterone that he is getting from the black market and definitely do not take estrogen suppressors. 4. I have told him that he is not a candidate for testosterone therapy in view of his desire to have children in the future as testosterone therapy can make him permanently infertile.  Clomiphene may be an option. 5. I will see him in about 4 months when we will get a true measure of what is baseline testosterone levels are in the morning providing he discontinued all testosterone immediately.   No orders of the defined types were placed in this encounter.   , MD

## 2020-08-10 LAB — VITAMIN D 25 HYDROXY (VIT D DEFICIENCY, FRACTURES): Vit D, 25-Hydroxy: 38 ng/mL (ref 30–100)

## 2020-12-07 ENCOUNTER — Other Ambulatory Visit (INDEPENDENT_AMBULATORY_CARE_PROVIDER_SITE_OTHER): Payer: Self-pay | Admitting: Internal Medicine

## 2020-12-07 ENCOUNTER — Ambulatory Visit (INDEPENDENT_AMBULATORY_CARE_PROVIDER_SITE_OTHER): Payer: BC Managed Care – PPO | Admitting: Internal Medicine
